# Patient Record
Sex: Female | Born: 1940
Health system: Southern US, Community
[De-identification: ages and names within clinical notes are randomized; demographics above are authoritative.]

## PROBLEM LIST (undated history)

## (undated) DIAGNOSIS — E785 Hyperlipidemia, unspecified: Secondary | ICD-10-CM

## (undated) DIAGNOSIS — R011 Cardiac murmur, unspecified: Secondary | ICD-10-CM

## (undated) DIAGNOSIS — K219 Gastro-esophageal reflux disease without esophagitis: Secondary | ICD-10-CM

## (undated) DIAGNOSIS — E559 Vitamin D deficiency, unspecified: Secondary | ICD-10-CM

## (undated) DIAGNOSIS — M810 Age-related osteoporosis without current pathological fracture: Secondary | ICD-10-CM

## (undated) HISTORY — DX: Gastro-esophageal reflux disease without esophagitis: K21.9

## (undated) HISTORY — PX: LEG SURGERY: SHX1003

## (undated) HISTORY — DX: Vitamin D deficiency, unspecified: E55.9

## (undated) HISTORY — PX: ABDOMINAL HYSTERECTOMY: SHX81

## (undated) HISTORY — DX: Cardiac murmur, unspecified: R01.1

## (undated) HISTORY — DX: Age-related osteoporosis without current pathological fracture: M81.0

## (undated) HISTORY — DX: Hyperlipidemia, unspecified: E78.5

---

## 1998-01-12 ENCOUNTER — Other Ambulatory Visit: Admission: RE | Admit: 1998-01-12 | Discharge: 1998-01-12 | Payer: Self-pay | Admitting: Obstetrics and Gynecology

## 1998-01-22 ENCOUNTER — Inpatient Hospital Stay (HOSPITAL_COMMUNITY): Admission: RE | Admit: 1998-01-22 | Discharge: 1998-01-24 | Payer: Self-pay | Admitting: Obstetrics and Gynecology

## 1999-04-16 ENCOUNTER — Other Ambulatory Visit: Admission: RE | Admit: 1999-04-16 | Discharge: 1999-04-16 | Payer: Self-pay | Admitting: Obstetrics and Gynecology

## 2000-07-07 ENCOUNTER — Other Ambulatory Visit: Admission: RE | Admit: 2000-07-07 | Discharge: 2000-07-07 | Payer: Self-pay | Admitting: Obstetrics and Gynecology

## 2006-10-25 ENCOUNTER — Ambulatory Visit: Admission: RE | Admit: 2006-10-25 | Discharge: 2006-10-25 | Payer: Self-pay | Admitting: Orthopedic Surgery

## 2006-10-25 ENCOUNTER — Ambulatory Visit: Payer: Self-pay | Admitting: Vascular Surgery

## 2006-10-27 ENCOUNTER — Inpatient Hospital Stay (HOSPITAL_COMMUNITY): Admission: RE | Admit: 2006-10-27 | Discharge: 2006-10-30 | Payer: Self-pay | Admitting: Orthopedic Surgery

## 2007-02-27 ENCOUNTER — Encounter: Payer: Self-pay | Admitting: Family Medicine

## 2007-03-02 ENCOUNTER — Encounter (INDEPENDENT_AMBULATORY_CARE_PROVIDER_SITE_OTHER): Payer: Self-pay | Admitting: Interventional Radiology

## 2007-03-02 ENCOUNTER — Ambulatory Visit (HOSPITAL_COMMUNITY): Admission: RE | Admit: 2007-03-02 | Discharge: 2007-03-02 | Payer: Self-pay | Admitting: Interventional Radiology

## 2007-03-14 ENCOUNTER — Encounter: Payer: Self-pay | Admitting: Interventional Radiology

## 2007-05-30 ENCOUNTER — Encounter: Admission: RE | Admit: 2007-05-30 | Discharge: 2007-05-30 | Payer: Self-pay | Admitting: Orthopedic Surgery

## 2008-02-07 ENCOUNTER — Inpatient Hospital Stay (HOSPITAL_COMMUNITY): Admission: RE | Admit: 2008-02-07 | Discharge: 2008-02-08 | Payer: Self-pay | Admitting: Urology

## 2008-11-27 IMAGING — CR DG CHEST 2V
2 series · 2 of 2 positions shown · non-contrast
Comparison: None.

CLINICAL DATA: Symptomatic distal femur hardware.  Preop respiratory exam.
 CHEST ? 2 VIEW:

[view not recorded (1 of 2)]
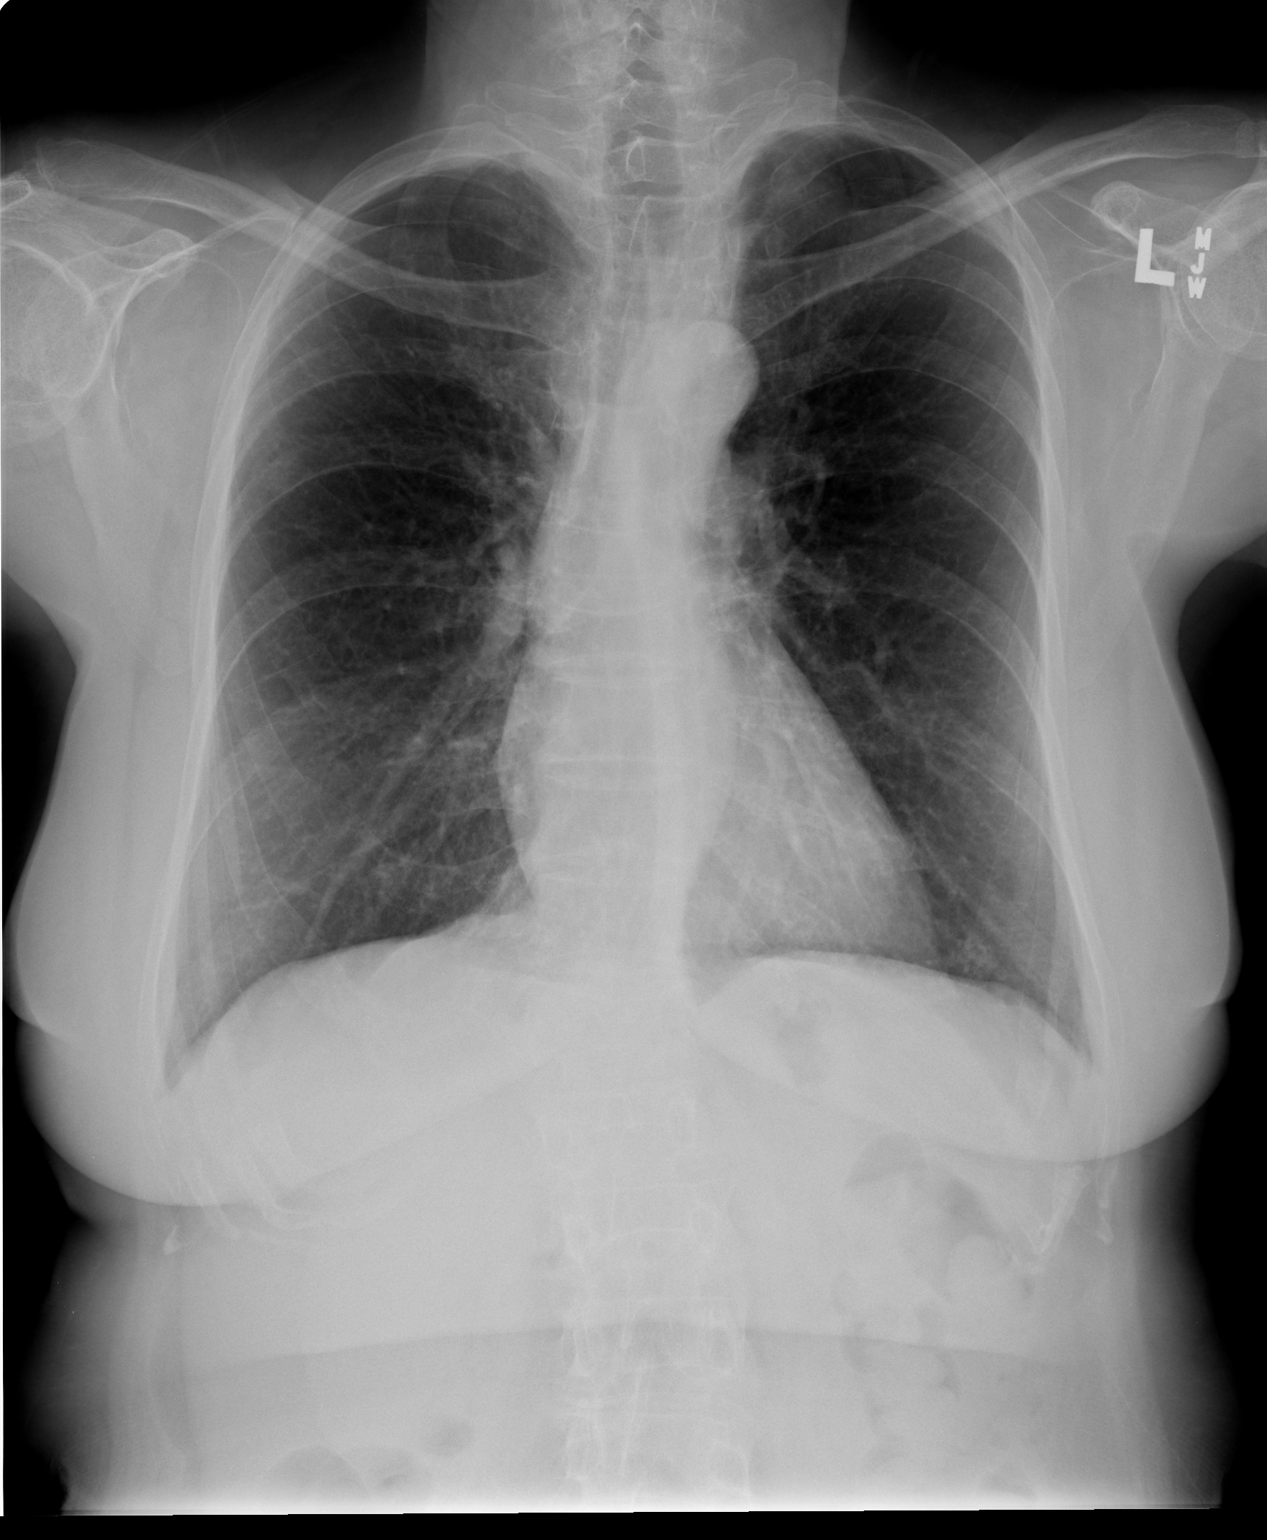

[view not recorded (2 of 2)]
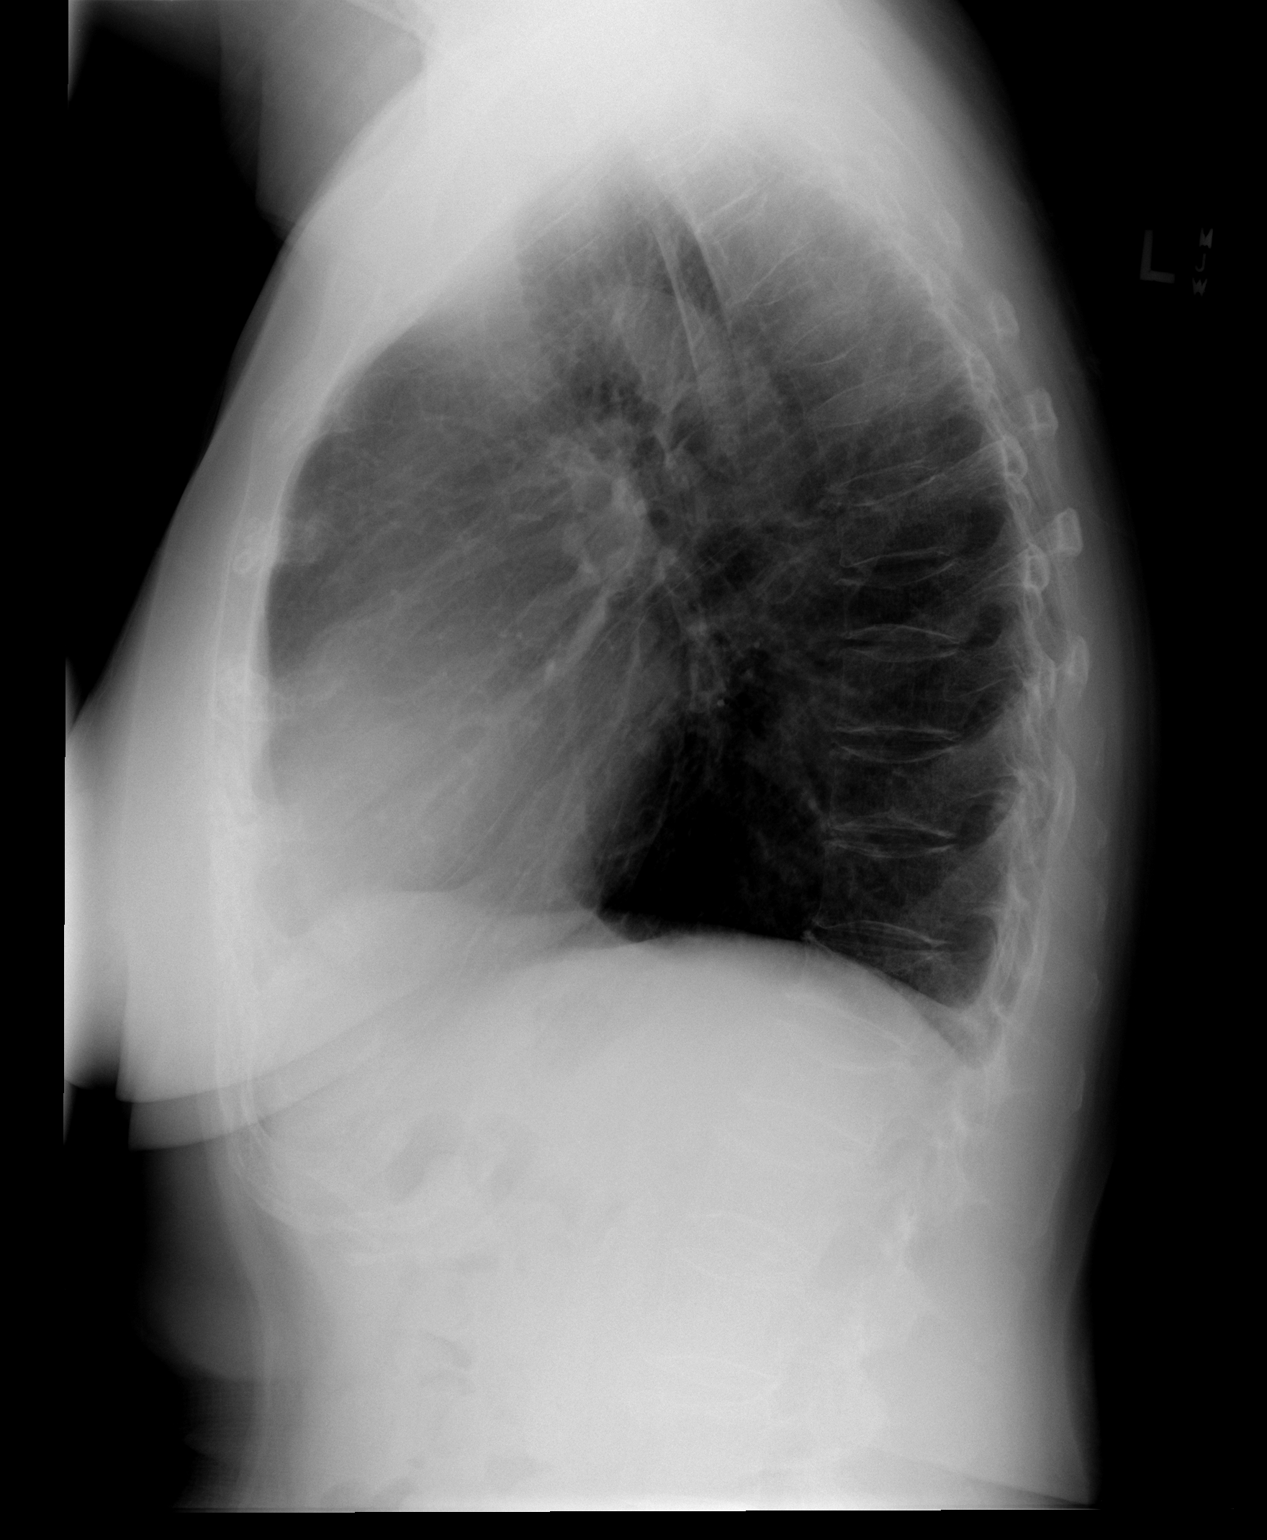

[2 of 2 positions shown; findings below may reference images not displayed]

FINDINGS: Heart size and mediastinal contours are normal.  Both lungs are clear.  Multiple thoracic vertebral body compression fracture deformities are noted.
IMPRESSION: 1. No active cardiopulmonary disease. 
 2. Multiple thoracic vertebral compression fractures, likely due to osteopenia.

## 2009-04-06 IMAGING — XA IR KYPHOPLASTY LUMBAR INIT
1 series · 12 of 24 positions shown · IV contrast (agent unspecified)
Comparison: none

CLINICAL DATA: Patient with severe intrathoracic pain secondary to compression fracture at T7.
 KYPHOPLASTY AT T7:
 Procedure:  Following the full explanation of the procedure, along with the potentially associated complications, an informed witnessed consent was obtained.
 The patient was laid prone on the fluoroscopic table.  The skin along the thoracic region was then prepped and draped in the usual sterile fashion.  The right pedicle at T7 was then infiltrated with 0.25% bupivacaine followed by the advancement of an 11-gauge Jamshidi needle through the right pedicle into the posterior one-third at T7.  This was then exchanged out for the Kyphon advanced osteo introducer system comprised of a working cannula and a KyphX osteo drill over a Kyphon osteo bone pin.  This combination was advanced until the tip of the KyphX osteo drill was in the posterior third at T7.  At this time, the bone pin was removed.  In the medial trajectory, the combination was advanced until the tip of the working cannula was in the posterior third at T7.  A core sample from this was sent for pathologic analysis.
 Through the working cannula, a Kyphon bone biopsy device was then advanced to within 5 mm of the anterior aspect of T7, and a core sample from this was also sent for pathologic analysis. 
 Through the working cannula, a Kyphon inflatable bone tamp 15 x 3 was advanced and positioned such that the distal marker was 5 mm from the anterior aspect of T7.  This balloon was then inflated with contrast using Kyphon inflation syringe device via microtubing.  The balloon was inflated until there was apposition with the superior endplate.  At this time, methylmethacrylate was reconstituted with tobramycin in the Kyphon bone mixing device system.  This was then loaded onto KyphX bone fillers. 
 The balloon was deflated and removed followed by the instillation of 1 bone filler equivalent of methylmethacrylate mixture at T7 with excellent filling in the AP and lateral projections.  No extravasation was noted in the disk space or posteriorly into the spinal canal or into the epidural or venous regions.  
 The working cannula and the bone fillers were then retrieved and removed.  Hemostasis was achieved at the skin entry site. 
 The patient tolerated the procedure well.  There were no acute complications.
 Medications utilized:  Versed 4 mg IV, fentanyl 100 mcg IV.

[Series 1: run · 12 of 76 slices shown]
[im 4/76]
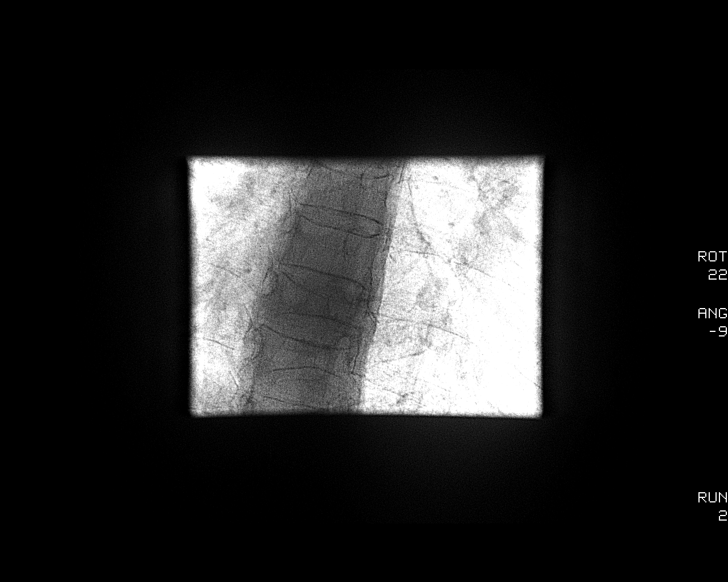
[im 10/76]
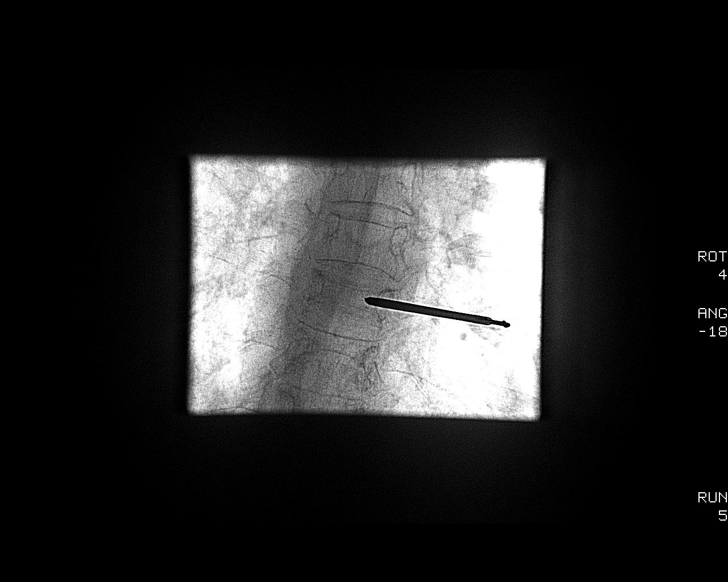
[im 17/76]
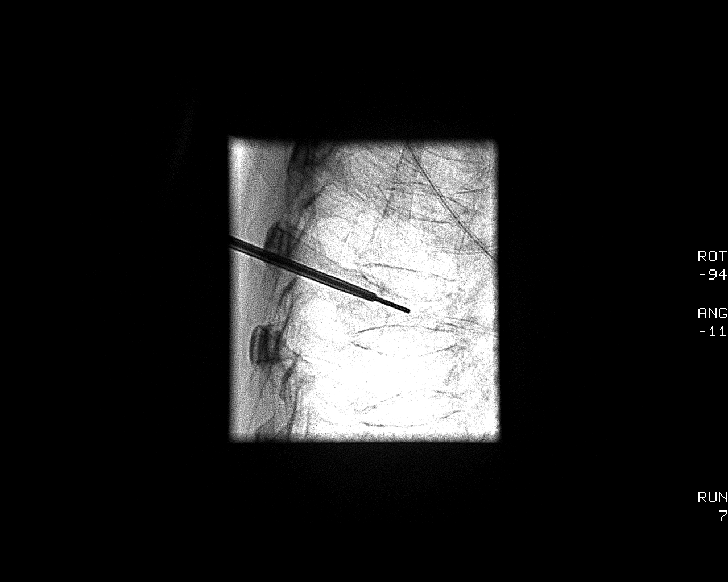
[im 23/76]
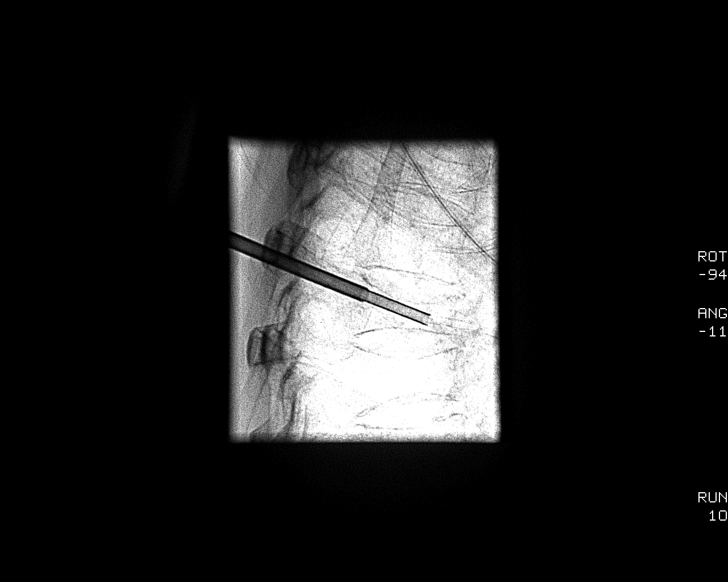
[im 30/76]
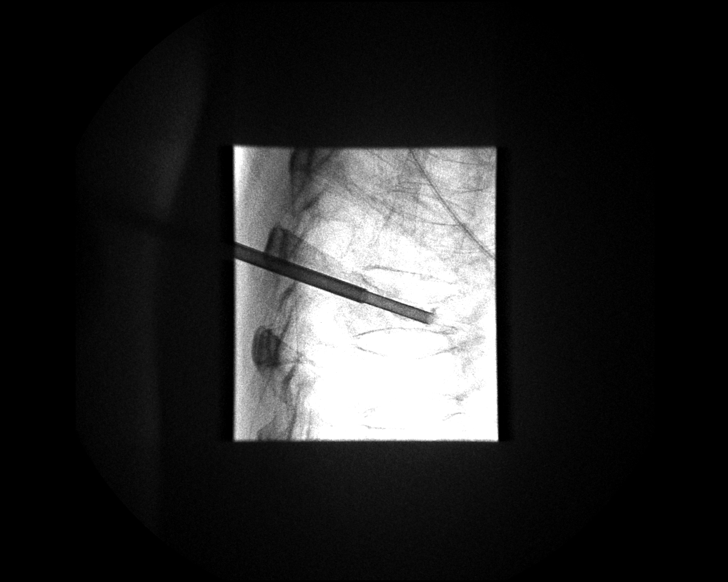
[im 36/76]
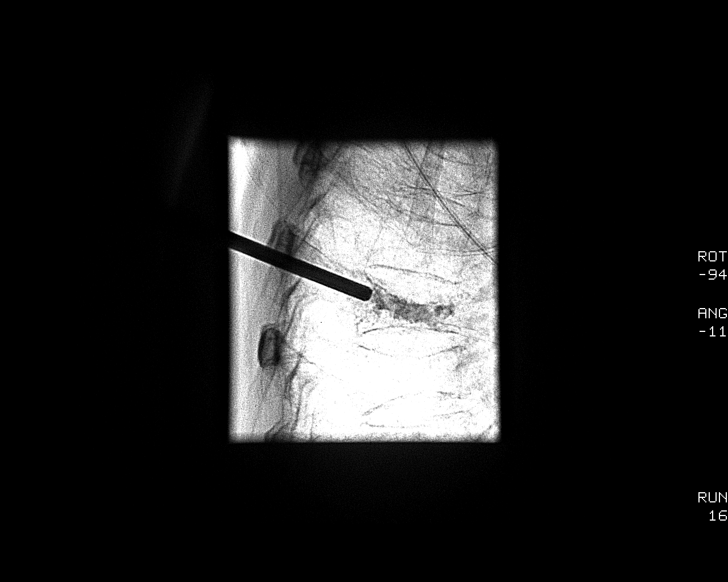
[im 43/76]
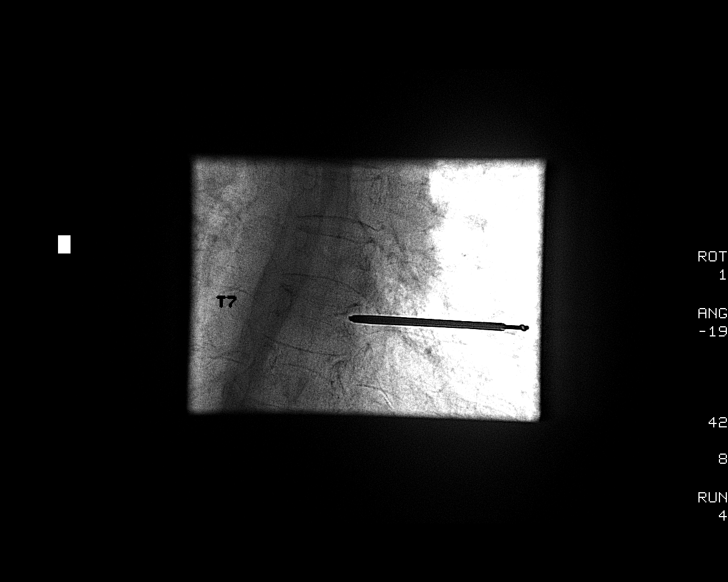
[im 49/76]
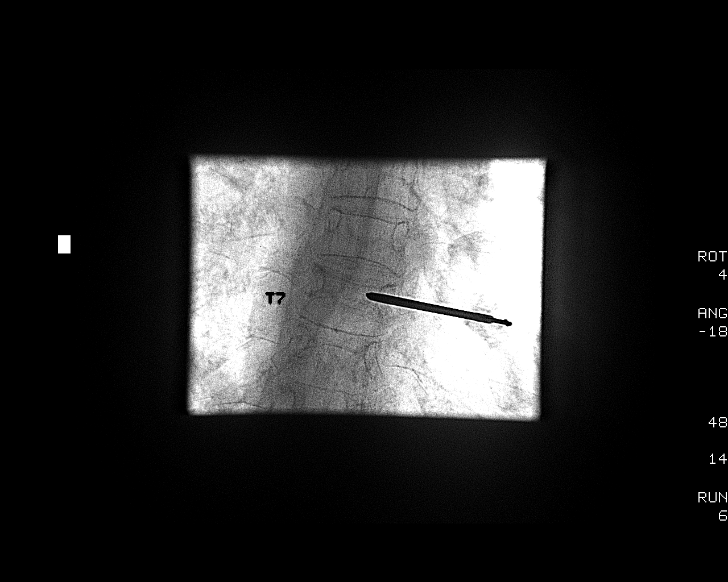
[im 56/76]
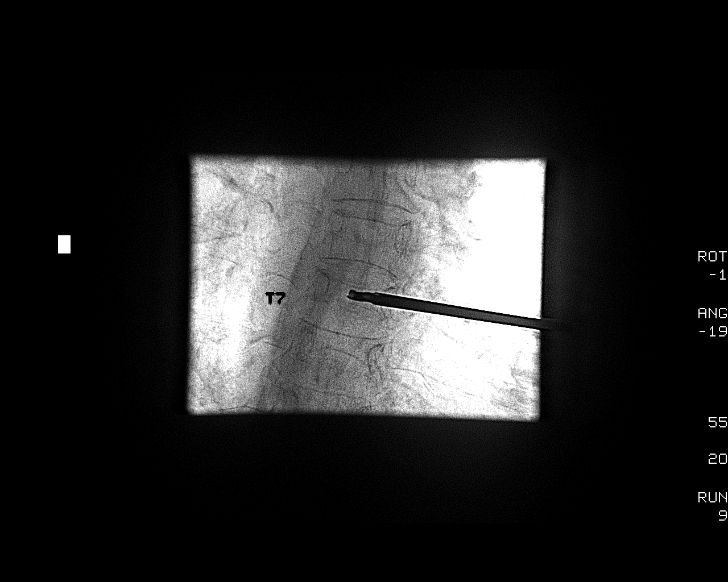
[im 62/76]
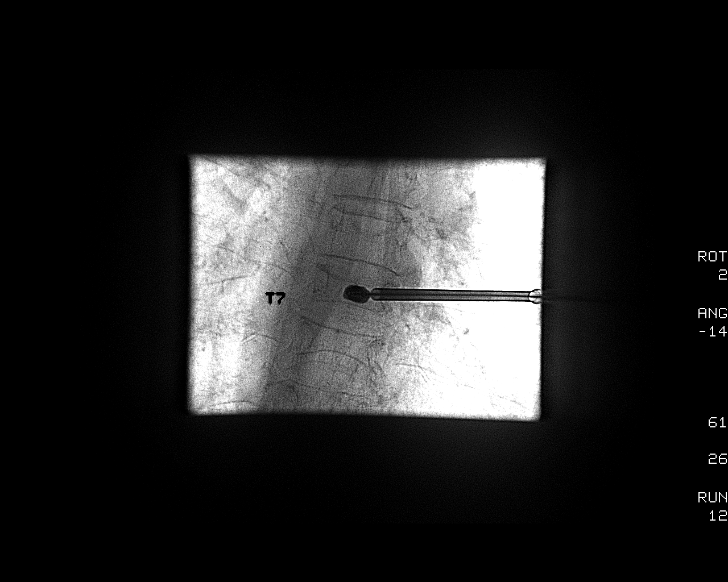
[im 69/76]
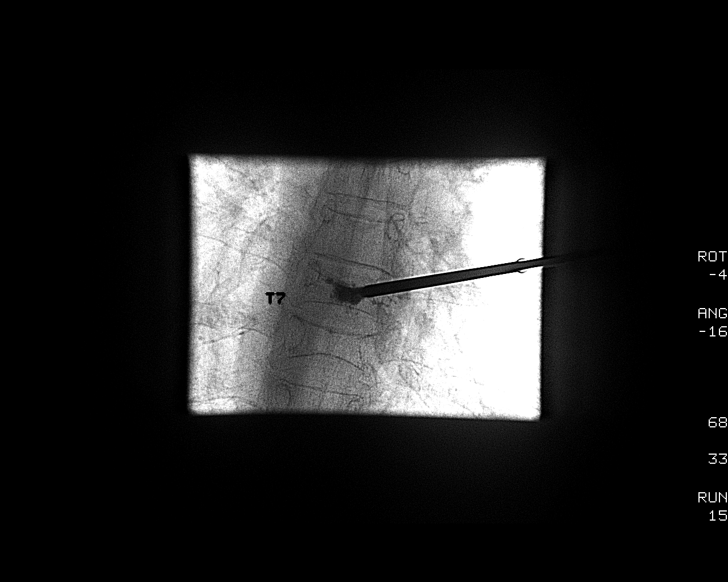
[im 76/76]
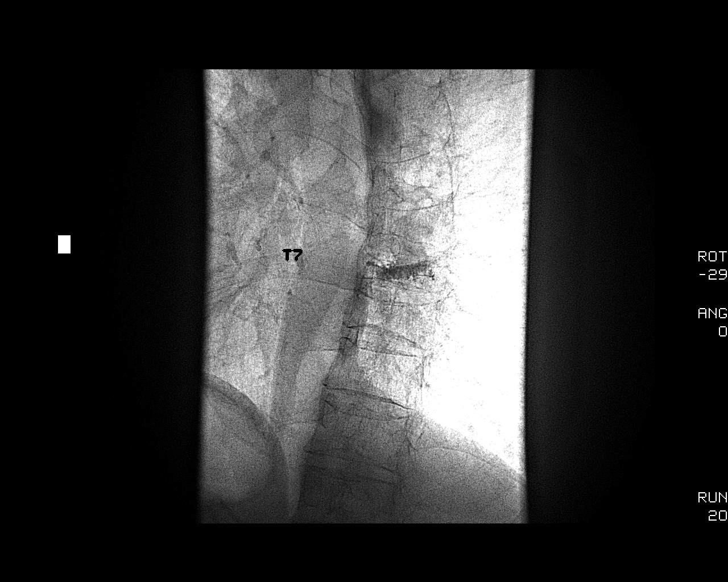

[12 of 24 positions shown; findings below may reference images not displayed]

IMPRESSION: 1.  Status post fluoroscopically guided needle placement for deep core bone biopsy at T7. 
 2.  Status post vertebral body augmentation for painful osteoporotic compression fracture at T7 using the balloon kyphoplasty technique as described above without event.

## 2010-02-14 ENCOUNTER — Encounter: Payer: Self-pay | Admitting: Orthopedic Surgery

## 2010-03-12 IMAGING — CR DG CHEST 2V
2 series · 2 of 2 positions shown · non-contrast
Comparison: 10/23/2006

CLINICAL DATA: Preop radiograph.  Vaginal vault prolapse.

CHEST - 2 VIEW

[w chest pa]
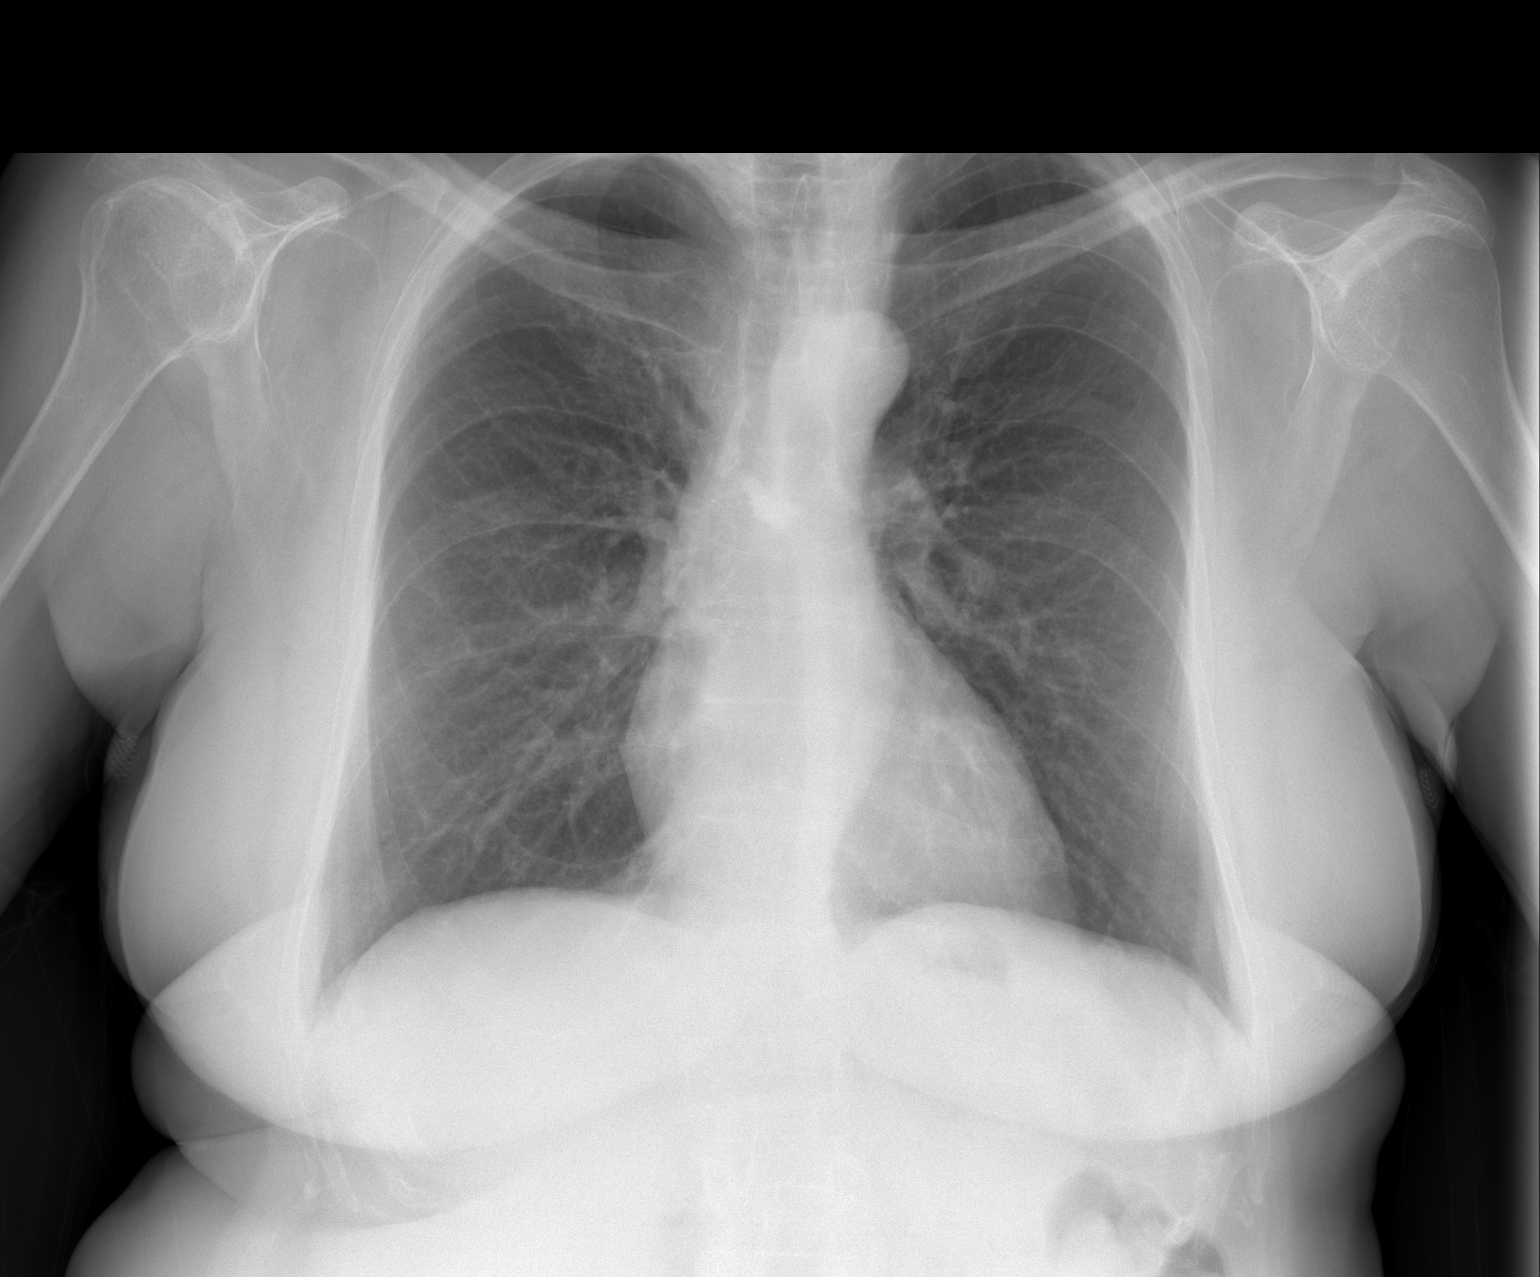

[w chest lat]
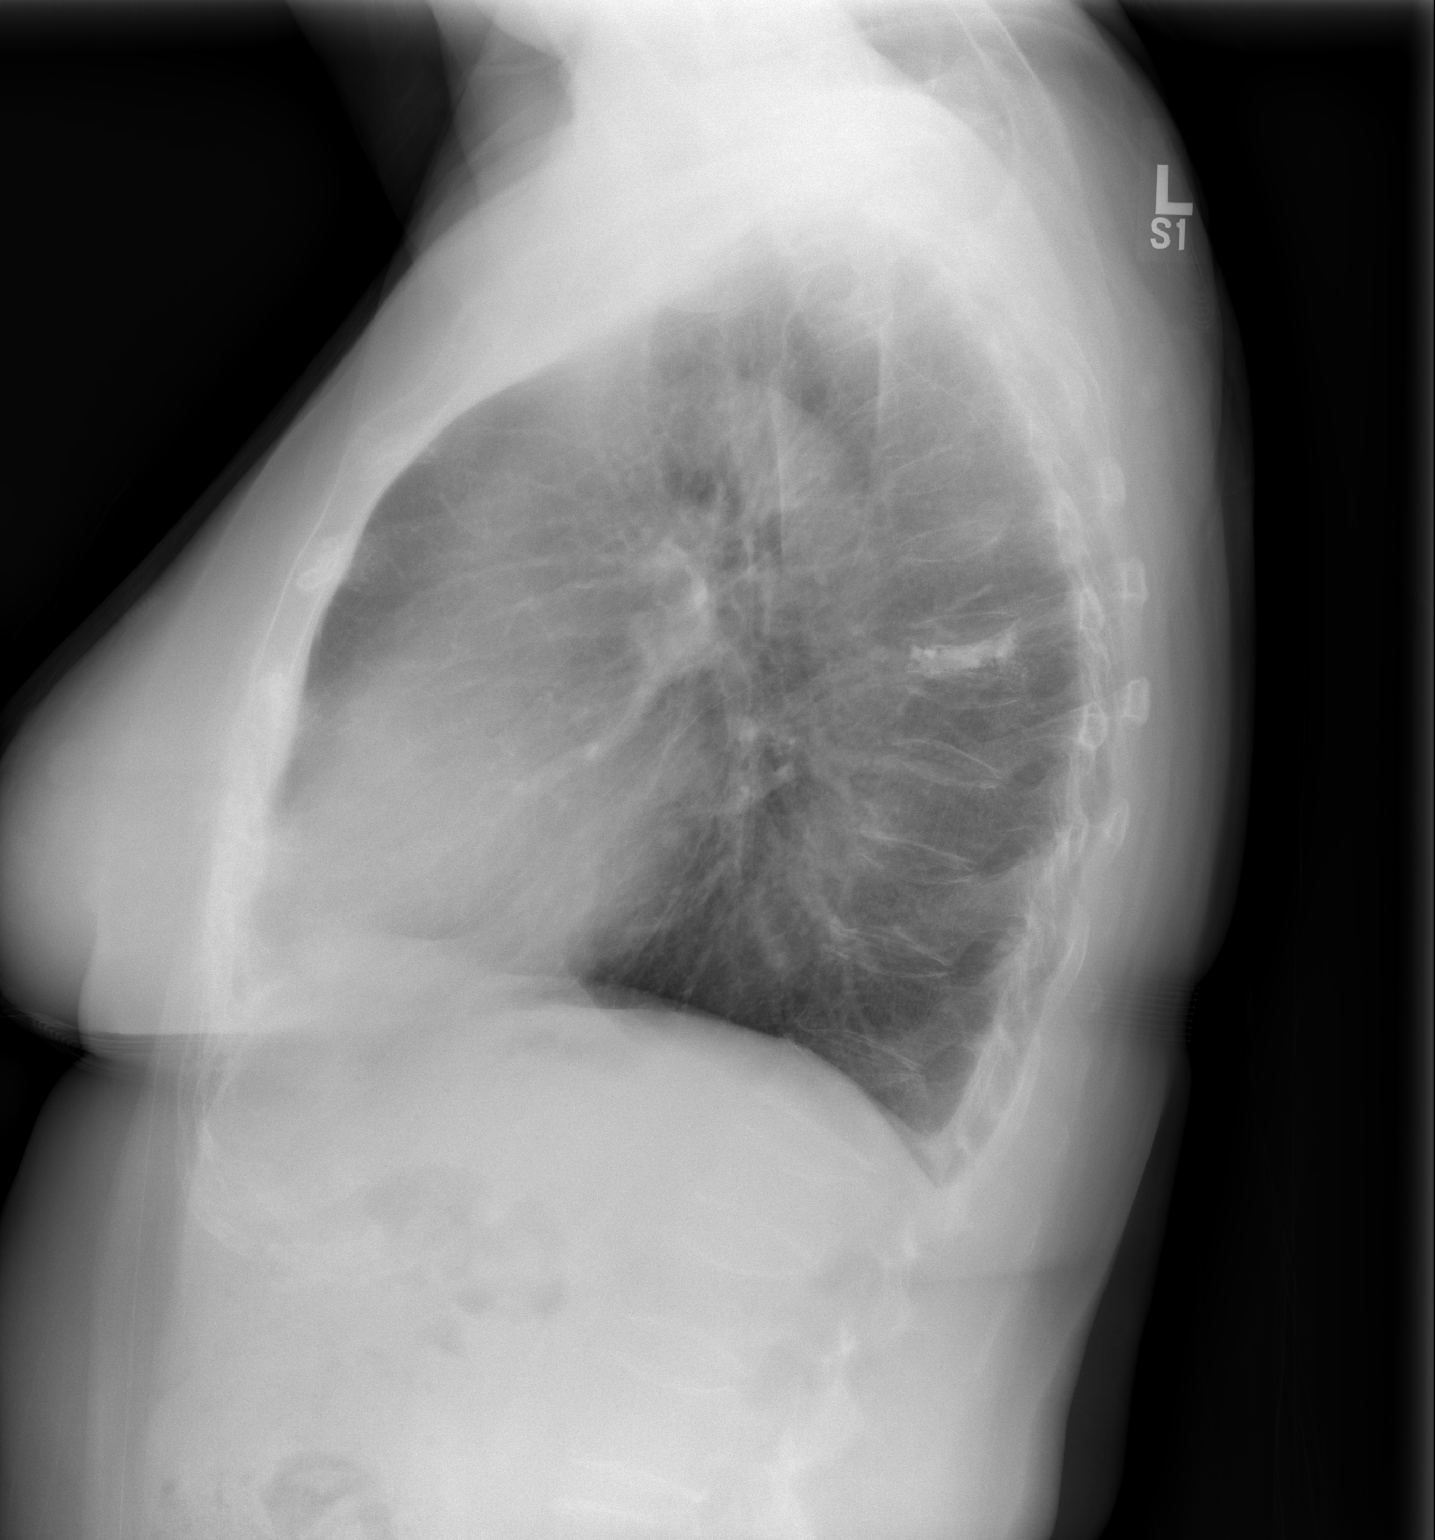

[2 of 2 positions shown; findings below may reference images not displayed]

FINDINGS: Heart size is normal.

There is no pleural effusion or pulmonary interstitial edema.

Multiple compression deformities are again noted within the
thoracic spine.

The patient is status post vertebroplasty at the T7 level.
IMPRESSION: 1.  No acute cardiopulmonary abnormalities.

## 2010-05-10 LAB — CBC
HCT: 43.5 % (ref 36.0–46.0)
Hemoglobin: 14.5 g/dL (ref 12.0–15.0)
MCHC: 33.3 g/dL (ref 30.0–36.0)
MCV: 90.6 fL (ref 78.0–100.0)
Platelets: 242 10*3/uL (ref 150–400)
RBC: 4.8 MIL/uL (ref 3.87–5.11)
RDW: 13.7 % (ref 11.5–15.5)
WBC: 5.9 10*3/uL (ref 4.0–10.5)

## 2010-05-10 LAB — TYPE AND SCREEN
ABO/RH(D): A POS
Antibody Screen: NEGATIVE

## 2010-05-10 LAB — BASIC METABOLIC PANEL
BUN: 10 mg/dL (ref 6–23)
CO2: 29 mEq/L (ref 19–32)
Calcium: 9.2 mg/dL (ref 8.4–10.5)
Chloride: 102 mEq/L (ref 96–112)
Creatinine, Ser: 0.61 mg/dL (ref 0.4–1.2)
GFR calc Af Amer: >60 mL/min (ref >60)
GFR calc non Af Amer: >60 mL/min (ref >60)
Glucose, Bld: 103 mg/dL — ABNORMAL HIGH (ref 70–99)
Potassium: 3.7 mEq/L (ref 3.5–5.1)
Sodium: 139 mEq/L (ref 135–145)

## 2010-05-10 LAB — HEMOGLOBIN AND HEMATOCRIT, BLOOD
HCT: 35.2 % — ABNORMAL LOW (ref 36.0–46.0)
HCT: 41.4 % (ref 36.0–46.0)
Hemoglobin: 11.7 g/dL — ABNORMAL LOW (ref 12.0–15.0)
Hemoglobin: 13.7 g/dL (ref 12.0–15.0)

## 2010-05-10 LAB — ABO/RH: ABO/RH(D): A POS

## 2010-06-08 NOTE — Op Note (Signed)
NAME:  Rhonda Rivera, Rhonda Rivera             ACCOUNT NO.:  [id number]   MEDICAL RECORD NO.:  [id number]          PATIENT TYPE:  INP   LOCATION:  NA                           FACILITY:  [facility]   PHYSICIAN:  Heloise Purpura, MD      DATE OF BIRTH:  1940-07-01   DATE OF PROCEDURE:  02/07/2008  DATE OF DISCHARGE:                               OPERATIVE REPORT   PREOPERATIVE DIAGNOSIS:  Vaginal wall prolapse.   POSTOPERATIVE DIAGNOSIS:  Vaginal wall prolapse.   PROCEDURE:  1. Robotic assisted laparoscopic sacrocolpopexy.  2. Cystoscopy.   SURGEON:  Heloise Purpura, MD   FIRST ASSISTANT:  Martina Sinner, MD   SECOND ASSISTANT:  Delia Chimes, NP   OTHER ASSISTANT:  Dr. Duane Boston.   ANESTHESIA:  General.   COMPLICATIONS:  None.   ESTIMATED BLOOD LOSS:  Less than 50 mL.   INDICATION:  [name] is a 70 year old female with post hysterectomy  vaginal vault prolapse.  She has undergone a thorough evaluation by Dr.  Alfredo Martinez and it was felt that she would best be served by an  abdominal sacrocolpopexy procedure.  She did wish to have this performed  in a minimally invasive fashion, therefore, was seen in consultation  specifically for a robotic assisted laparoscopic procedure.  The  potential risks, complications and alternative treatment options were  discussed in detail as well as the possibility that she may require a  rectocele repair by Dr. Sherron Monday at the time of this procedure.  The  potential risks, complications and alternative treatment options were  discussed in detail with the patient and informed consent was obtained.   DESCRIPTION OF PROCEDURE:  The patient was taken to the operating room  and a general anesthetic was administered.  She was given preoperative  antibiotics, placed in the dorsal lithotomy position and prepped and  draped in the usual sterile fashion.  Next a Foley catheter was placed.  A site was then selected just superior to the umbilicus in the  midline  for placement of the camera port.  This was placed using a standard open  Hasson technique which allowed entry into the peritoneal cavity under  direct vision and without difficulty.  There were no intra-abdominal  injuries or other abnormalities.  The remaining ports were then placed;  8 mm robotic ports were placed in the right lower quadrant, left lower  quadrant and far left lower quadrant.  A 12 mm port was placed in the  far right lateral abdominal wall.  All ports were placed under direct  vision without difficulty.  The surgical cart was then docked.  With a  combination of cautery and sharp dissection the apex of the vagina was  incised horizontally.  The EEA sizer was placed into the vagina to help  visualize the vaginal apex for this portion of the procedure.  The  peritoneal flaps on the anterior and posterior cuff of the vagina were  then developed in the avascular plane underneath the peritoneum.  Once  an adequate length of the vaginal cuff had been exposed on either side  attention turned  to the sacral promontory after the sigmoid colon had  been reflected laterally to the left.  The sacral promontory was easily  identified and was exposed.  The peritoneum over the sacral promontory  was identified and the periosteum was then exposed with care to avoid  any presacral vessels.  The Y-shaped IntePro mesh was then cut to the  appropriate length and brought into the operative field.  The posterior  and anterior portions of the Y-shaped graft were then sewn to the  respective sides of the vaginal cuff with four 2-0 Gore-Tex sutures on  each side of the vaginal cuff.  These were placed with figure-of-eight  deep sutures.  The vaginal apex was then supported with the EEA sizer to  determine the appropriate length of mesh needed.  The excess mesh was  then cut on the proximal side and three 2-0 Gore-Tex figure-of-eight  sutures were used to secure the mesh to the  periosteum of the sacral  promontory.  This resulted in excellent apical support of the vagina.  The peritoneum was then closed over the mesh with running 2-0 Vicryl  sutures.  Both the left lateral and right lateral cul-de-sac gutters  were then closed with 2-0 Vicryl sutures as well to prevent an  enterocele.  The surgical cart was then undocked.  Indigo carmine was  administered and cystoscopy was performed.  There was noted to be blue  reflux from either ureter indicating no evidence of ureteral injury.  The vaginal vault was then carefully examined by Dr. Sherron Monday.  He did  not feel that a rectocele repair was indicated.  Attention then turned  to closure.   Both 12 mm port sites were closed with zero Vicryl sutures that were  preplaced with the suture passer device.  All remaining ports were  removed under direct vision and the pneumoperitoneum was expelled.  All  port sites were injected with quarter percent Marcaine and  reapproximated at the skin level with staples.  Sterile dressings were  applied.  The patient appeared to tolerate the procedure well without  complications.  She was able to be extubated and transferred to the  recovery unit in satisfactory condition.      Heloise Purpura, MD  Electronically Signed     LB/MEDQ  D:  02/07/2008  T:  02/07/2008  Job:  [id number]
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
## 2010-06-08 NOTE — Op Note (Signed)
NAME:  Rhonda Rivera, Rhonda Rivera             ACCOUNT NO.:  [id number]   MEDICAL RECORD NO.:  [id number]          PATIENT TYPE:  INP   LOCATION:  5010                         FACILITY:  [facility]   PHYSICIAN:  Doralee Albino. Carola Frost, M.D. DATE OF BIRTH:  05/08/1940   DATE OF PROCEDURE:  10/27/2006  DATE OF DISCHARGE:                               OPERATIVE REPORT   PREOPERATIVE DIAGNOSES:  Left knee contracture. symptomatic hardware.   POSTOPERATIVE DIAGNOSES:  1. Left knee contracture. symptomatic hardware.  2. Severe post-traumatic arthrosis of the patella.   PROCEDURES:  1. Arthroscopic exploration, attempted lysis of adhesions.  2. Open lysis of adhesions.  3. Manipulation.  4. Removal of deep implant.   SURGEON:  Myrene Galas, M.D.   ASSISTANT:  None.   ANESTHESIA:  General.   FINDINGS:  Dense scar.   SPECIMENS:  None.   RANGE OF MOTION:  Premanipulation 25 degrees, post manipulation 120  degrees.   TOTAL TOURNIQUET TIME:  70 minutes.   COMPLICATIONS:  None.   ESTIMATED BLOOD LOSS:  70 mL.   DISPOSITION:  To PACU.   CONDITION:  Stable.   BRIEF SUMMARY AND INDICATIONS FOR PROCEDURE:  Rhonda Rivera is a 70-year-  old female who has been followed for a left distal femur fracture  treated by another Careers adviser in [place].  She had severe  difficulty despite every intervention that we could summon short of  surgery during her rehabilitation with regard to reestablishing range of  motion.  As a consequence, she had persistently restricted range of  motion, and we had been awaiting sufficient union of her fracture to  proceed with formal lysis and removal of adhesions and removal of the  implant which was also prominent medially and tender.  We discussed the  risks and benefits of surgery including the possibility of refracture,  infection, nerve injury, vessel injury, loss of the gained range of  motion, injury to the quadriceps, patella or tendon portion of the  extension mechanism, need for further surgery, failure to alleviate  symptoms, DVT, PE and others.  After full discussion, the patient wished  to proceed as did her husband.   BRIEF DESCRIPTION OF PROCEDURE:  Rhonda Rivera was administered  preoperative antibiotics, taken to the operating room where general  anesthesia was induced.  Her left lower extremity was prepped and draped  in usual sterile fashion.  We did place a tourniquet about the thigh.  We elevated the leg and exsanguinated with the Esmarch bandage.  We then  created standard inferomedial and inferolateral portals.  We were able  to get in the suprapatellar pouch with the scope but had considerable  difficulty doing so with the shaver secondary to the severe fibrosis.  We were able to begin with arthroscopic lysis.  However, the scar was  exceedingly thick, over a centimeter, and was throughout the knee.  It  became evident fairly quickly as we were unable to penetrate the bands  with the most aggressive shaver available.  At that time, we removed the  arthroscopic instruments from the knee and converted to open.   We  reopened the lateral incision which was over 15 cm.  There was a  dense fibrous scar.  We carried this down to the vastus and elevated the  distal quadriceps.  We removed extensive scar in the suprapatellar  region.  We could see where we had performed some release proximally  along the medial side.  The patella was completely entrenched within the  trochlear groove and could not be mobilized.  Consequently, we had to  extend the incision distally and begin working in very tedious fashion  to develop the plane deep to the patellar tendon and along the sulcus  until we could release enough fibrous tissue to actually tilt the  patella and then later to release the fibrous bands attaching from the  medial femoral condyle directly on the articular surface to the  articular surface of the patella.  As we released  this, we could see  that the patella had marked post-traumatic deformity and osteophytes  which protruded off of it directly into the joint surface.  These were  debrided with the rongeur, and eventually, we were able to obtain  sufficient mobility.  We then brought the knee up into flexion and both  palpably and audibly fibrous scar was released to the point that she was  able to obtain 120 degrees of motion.  The extensor mechanism remained  intact as visualized through the arthroscopic portal as well as  palpably.  The wounds were copiously irrigated and then closed in  standard layered fashion after the patellar plasty again to remove those  severe abnormalities.  We did release the tourniquet prior to closure  and obtain hemostasis with electrocautery.  Sterile gently compressive  dressing was applied with the knee in 90 degrees or more of flexion to  facilitate the use of the CPM in maximal flexion.  The deep drain was  placed prior to closure as well and sewed into place.   Rhonda Rivera is at high risk for recurrence of her contracture.  She will  have an epidural for the next 2-3 days and then will be discharged  immediately to physical therapy.  She will begin CPM motion and maximal  flexion and work back toward extension in hope to obtain and maintain as  much flexion as possible.  I have discussed all of these findings with  her husband who understands the risk.      Doralee Albino. Carola Frost, M.D.  Electronically Signed     MHH/MEDQ  D:  10/27/2006  T:  10/28/2006  Job:  [id number]

## 2010-06-11 NOTE — Discharge Summary (Signed)
NAME:  Rhonda Rivera, Rhonda Rivera             ACCOUNT NO.:  [id number]   MEDICAL RECORD NO.:  [id number]          PATIENT TYPE:  INP   LOCATION:  1434                         FACILITY:  [facility]   PHYSICIAN:  Heloise Purpura, MD      DATE OF BIRTH:  Dec 12, 1940   DATE OF ADMISSION:  02/07/2008  DATE OF DISCHARGE:  02/08/2008                               DISCHARGE SUMMARY   ADMISSION DIAGNOSIS:  Vaginal wall prolapse.   DISCHARGE DIAGNOSIS:  Vaginal wall prolapse.   PROCEDURE:  1. Robotic assisted laparoscopic sacral colpopexy.  2. Cystoscopy.   HISTORY AND PHYSICAL:  For full details, please see admission History  and Physical.   Briefly, [name] is a 70 year old female who was found to have  vaginal wall prolapse.   After careful consideration regarding management and options for  treatment, she elected to proceed with surgical therapy and a robotic  assisted laparoscopic sacral colpopexy.   HOSPITAL COURSE:  On February 07, 2008 she was taken to the operating  room and underwent a robotic assisted laparoscopic sacral colpopexy  which she tolerated well and without complications.  Postoperatively,  she was able to be transferred to a regular hospital room following  recovery from anesthesia. She was able to begin ambulation that evening.  She remained hemodynamically stable.  Her postoperative hematocrit was  41.4.  On the morning of postoperative day #1, her hematocrit was also  found to be stable at 35.2.  She maintained excellent urine output from  her Foley catheter  and no drainage from her vaginal packing, therefore,  her Foley catheter and vaginal packing were removed.  Her postvoid  residuals remained low.  She was re-evaluated on the afternoon of  postoperative day #1.  Her urine output, blood pressure and pulse  remained stable. She continued to have low postvoid residuals.  She was  also able to tolerate her diet without nausea or vomiting.  Therefore  she was felt to be  stable for discharge and had met all discharge  criteria.   DISPOSITION:  Home.   DISCHARGE MEDICATIONS:  She was provided a prescription for Vicodin to  take as needed for pain and told to use Colace as a stool softener.  She  was instructed to hold her aspirin, herbal supplements and multivitamins  for ten days.   DISCHARGE INSTRUCTIONS:  She was instructed to be ambulatory but  specifically told to refrain from any heavy lifting, strenuous activity  or driving. She was told to gradually advanced her diet as the course of  the next few days.   FOLLOWUP:  She will followup in one week for observation.      Delia Chimes, NP      Heloise Purpura, MD  Electronically Signed    MA/MEDQ  D:  02/12/2008  T:  02/12/2008  Job:  [id number]

## 2010-06-11 NOTE — Discharge Summary (Signed)
NAME:  JANUS, VLCEK NO.:  [id number]   MEDICAL RECORD NO.:  [id number]          PATIENT TYPE:  OUT   LOCATION:  DFTL                         FACILITY:  [facility]   PHYSICIAN:  Mearl Latin, PA       DATE OF BIRTH:  August 01, 1940   DATE OF ADMISSION:  10/25/2006  DATE OF DISCHARGE:  10/25/2006                               DISCHARGE SUMMARY   DISCHARGE DIAGNOSES:  1. Left knee contracture with symptomatic hardware.  2. Severe posttraumatic arthrosis of the patella.   PROCEDURES PERFORMED:  1. Arthroscopic exploration, attempted lysis of adhesion.  2. Open lysis of adhesions.  3. Manipulation.  4. Removal of deep implant of the left femur.   BRIEF SUMMARY AND HOSPITAL COURSE:  Ms. Manalang is a 70 year old  Caucasian female, who has a past history of a left distal femur  fracture.  This fracture was treated by another surgeon in [address].  After having an ORIF of the left distal femur, she was  subsequently followed by our practice for follow up care.  Over the  course of her rehabilitation from the initial surgery, Ms. Seier  continued to have difficulty with regards to establishing full range of  motion of upper left knee.  As a consequence, she had persistently  reached __________ good range of motion.  Surgical intervention had not  been pursued sooner due to the fact that we had been awaiting sufficient  union of her fracture, in order to proceed with a formal lysis and  removal of the adhesion and removal of the implant, which also caused  significant tenderness over the medial aspect, in addition to being  prominent over the medial aspect of the femur.  This surgical procedure  was performed on October 3rd, 2008, after which time Ms. Nokes was  able to be ranged 120 degrees of knee flexion.  Ms. Bark remained in  the hospital for 3 days, for continuous CPM use, in addition to pain  management.  It was thought that enabling her to remain  in the hospital  for a couple of days, she would be able to make more sufficient gain  with respect to her range of motion.  On postoperative day number 3, Ms.  Manolis was discharged from the hospital with instructions to resume  physical therapy the next day.  Ms. Slabaugh did not have any undue  complications during her hospital course.   PERTINENT LABORATORY FINDINGS:  Sodium of 137.  Potassium of 3.6.  Chloride of 107.  CO2 of 27.  Glucose 104.  BUN of 5.  Creatinine of  0.62.  GFR is greater than 60.  Calcium is 8.4.  CBC:  WBCs are 9.9.  Hemoglobin is 11.0.  Hematocrit is 32.8.  Platelets of 226.   OTHER SIGNIFICANT FINDINGS:  It is worth note that premanipulation, Ms.  Willette was able to range only 25 degrees of flexion of her left knee  and post manipulation, Ms. Willette could be ranged 120 degrees of  flexion in her left knee.  Despite the low hemoglobin and hematocrit,  Ms.  Willette remained asymptomatic upon discharge.  The patient did not  complain of any dizziness, lightheadedness, shortness of breath, chest  pain.   DISCHARGE MEDICATIONS:  1. Percocet 5/325.  2. Robaxin 500 mg.   Full medications:  1. Tramadol 50 mg taken as needed.  2. Sertraline 50 mg taken daily.  3. Xanax 0.5 mg as needed.   DISCHARGE INSTRUCTIONS:  Ms. Tobey continues to be a high risk for  reoccurrence of her contracture, given her past history.  Upon  discharge, she is instructed to report to Physical Therapy the next day.  She will also be given a CPM machine to facilitate range of motion of  her left knee.  She will be given the CPM motion and maximal flexion and  work back towards extension in hopes to obtain and maintain as much  flexion as possible.  Ms. Kuna will be weightbearing as tolerated on  the left lower extremity.  Due to the fact that the patient has  maintained an ambulatory status, no chemical DVT prophylaxis is  warranted at this time.  We plan on seeing Ms.  Willette in approximately  10-14 days for follow up at our office.      Mearl Latin, PA     KWP/MEDQ  D:  11/20/2006  T:  11/20/2006  Job:  [id number]

## 2010-10-15 LAB — BASIC METABOLIC PANEL
BUN: 6
CO2: 27
Calcium: 9
Chloride: 101
Creatinine, Ser: 0.58
GFR calc Af Amer: >60
GFR calc non Af Amer: >60
Glucose, Bld: 103 — ABNORMAL HIGH
Potassium: 3.6
Sodium: 137

## 2010-10-15 LAB — PROTIME-INR
INR: 0.9
Prothrombin Time: 12.7

## 2010-10-15 LAB — CBC
HCT: 44.6
Hemoglobin: 14.8
MCHC: 33.3
MCV: 84.6
Platelets: 349
RBC: 5.27 — ABNORMAL HIGH
RDW: 14.9
WBC: 7.7

## 2010-11-04 LAB — CBC
HCT: 32.8 — ABNORMAL LOW
HCT: 41.7
Hemoglobin: 11 — ABNORMAL LOW
Hemoglobin: 13.9
MCHC: 33.5
MCHC: 33.3
MCV: 84.8
MCV: 85.5
Platelets: 226
Platelets: 325
RBC: 3.86 — ABNORMAL LOW
RBC: 4.88
RDW: 15.3 — ABNORMAL HIGH
RDW: 15.6 — ABNORMAL HIGH
WBC: 9.9
WBC: 6.6

## 2010-11-04 LAB — BASIC METABOLIC PANEL
BUN: 5 — ABNORMAL LOW
BUN: 7
CO2: 27
CO2: 31
Calcium: 8.4
Calcium: 9.6
Chloride: 107
Chloride: 101
Creatinine, Ser: 0.62
Creatinine, Ser: 0.58
GFR calc Af Amer: >60
GFR calc Af Amer: >60
GFR calc non Af Amer: >60
GFR calc non Af Amer: >60
Glucose, Bld: 104 — ABNORMAL HIGH
Glucose, Bld: 102 — ABNORMAL HIGH
Potassium: 3.6
Potassium: 4.4
Sodium: 137
Sodium: 140

## 2010-11-04 LAB — PROTIME-INR
INR: 0.9
Prothrombin Time: 12.7

## 2010-11-04 LAB — URINALYSIS, ROUTINE W REFLEX MICROSCOPIC
Bilirubin Urine: NEGATIVE
Glucose, UA: NEGATIVE
Hgb urine dipstick: NEGATIVE
Ketones, ur: NEGATIVE
Nitrite: NEGATIVE
Protein, ur: NEGATIVE
Specific Gravity, Urine: 1.011
Urobilinogen, UA: 0.2
pH: 6.5

## 2010-11-04 LAB — APTT: aPTT: 27

## 2017-06-08 ENCOUNTER — Encounter: Payer: Self-pay | Admitting: *Deleted

## 2017-07-13 DIAGNOSIS — M810 Age-related osteoporosis without current pathological fracture: Secondary | ICD-10-CM

## 2017-07-13 DIAGNOSIS — K219 Gastro-esophageal reflux disease without esophagitis: Secondary | ICD-10-CM | POA: Insufficient documentation

## 2017-07-13 DIAGNOSIS — R011 Cardiac murmur, unspecified: Secondary | ICD-10-CM | POA: Insufficient documentation

## 2017-07-13 DIAGNOSIS — E559 Vitamin D deficiency, unspecified: Secondary | ICD-10-CM | POA: Insufficient documentation

## 2017-07-13 DIAGNOSIS — E785 Hyperlipidemia, unspecified: Secondary | ICD-10-CM

## 2017-07-13 HISTORY — DX: Gastro-esophageal reflux disease without esophagitis: K21.9

## 2017-07-13 HISTORY — DX: Hyperlipidemia, unspecified: E78.5

## 2017-07-13 HISTORY — DX: Age-related osteoporosis without current pathological fracture: M81.0

## 2017-07-13 HISTORY — DX: Cardiac murmur, unspecified: R01.1

## 2017-07-13 HISTORY — DX: Vitamin D deficiency, unspecified: E55.9

## 2017-07-31 NOTE — Progress Notes (Signed)
Cardiology Office Note:    Date:  08/01/2017   ID:  Rhonda Rivera, DOB 01/14/1941, MRN [id number]  PCP:  Blane Oharaox, Kirsten, MD  Cardiologist:  Norman HerrlichBrian Rocklyn Mayberry, MD   Referring MD: Marlyn CorporalYates, Kate H, PA  ASSESSMENT:    1. Cardiac murmur   2. Atrial premature beats   3. Nonrheumatic aortic valve stenosis   4. Hyperlipidemia, unspecified hyperlipidemia type    PLAN:    In order of problems listed above:  1. Clinically appears to have moderate aortic stenosis I asked her to go ahead and that echocardiogram for diagnosis and at this time would not advocate any cardiac interventions but indeed if it confirms aortic stenosis she need to start endocarditis prophylaxis and follow-up echocardiogram in 6 to 5864-month intervals. 2. Asymptomatic I would not give her any suppressant treatment 3. Moderate by physical exam echocardiogram to confirm and allow planning for follow-up and assessment of need for intervention. 4. Mild has not required lipid-lowering therapy stable  Next appointment 4 weeks   Medication Adjustments/Labs and Tests Ordered: Current medicines are reviewed at length with the patient today.  Concerns regarding medicines are outlined above.  Orders Placed This Encounter  Procedures  . EKG 12-Lead  . ECHOCARDIOGRAM COMPLETE   No orders of the defined types were placed in this encounter.    Chief Complaint  Patient presents with  . New Patient (Initial Visit)    History of Present Illness:    Rhonda Rivera is a 77 y.o. female who is being seen today for the evaluation of a heart murmur at the request of Marlyn CorporalYates, Kate H, [place].  He was described as grade 5-6 blowing murmur  She is a very vigorous active woman she has no history of previous heart murmur to her knowledge congenital rheumatic heart disease with the exception of hot humid weather where she fatigues a little bit easier she has had no exercise intolerance chest pain shortness of breath syncope palpitation TIA or  claudication.  She considers herself healthy she has mild hyperlipidemia has not required lipid-lowering therapy has no family history of valvular heart disease  She was noted to have a prominent heart murmur  Past Medical History:  Diagnosis Date  . Cardiac murmur 07/13/2017  . GERD (gastroesophageal reflux disease) 07/13/2017  . Hyperlipidemia 07/13/2017  . Osteoporosis 07/13/2017  . Vitamin D deficiency 07/13/2017    Past Surgical History:  Procedure Laterality Date  . ABDOMINAL HYSTERECTOMY    . LEG SURGERY      Current Medications: Current Meds  Medication Sig  . Ascorbic Acid (VITAMIN C) 1000 MG tablet Take 1,000 mg by mouth daily.  . Cholecalciferol (VITAMIN D) 2000 units CAPS Take 1 capsule by mouth daily.  . Coenzyme Q10 (CO Q 10) 100 MG CAPS Take 1 capsule by mouth daily.  Marland Kitchen. GRAPE SEED EXTRACT PO Take 1 tablet by mouth daily.     Allergies:   Acetaminophen; Celebrex [celecoxib]; and Penicillins   Social History   Socioeconomic History  . Marital status: Married    Spouse name: Not on file  . Number of children: Not on file  . Years of education: Not on file  . Highest education level: Not on file  Occupational History  . Not on file  Social Needs  . Financial resource strain: Not on file  . Food insecurity:    Worry: Not on file    Inability: Not on file  . Transportation needs:    Medical: Not on  file    Non-medical: Not on file  Tobacco Use  . Smoking status: Never Smoker  . Smokeless tobacco: Never Used  Substance and Sexual Activity  . Alcohol use: Never    Frequency: Never  . Drug use: Never  . Sexual activity: Not on file  Lifestyle  . Physical activity:    Days per week: Not on file    Minutes per session: Not on file  . Stress: Not on file  Relationships  . Social connections:    Talks on phone: Not on file    Gets together: Not on file    Attends religious service: Not on file    Active member of club or organization: Not on file     Attends meetings of clubs or organizations: Not on file    Relationship status: Not on file  Other Topics Concern  . Not on file  Social History Narrative  . Not on file     Family History: The patient's family history includes Heart attack in her father.  ROS:   ROS Please see the history of present illness.     All other systems reviewed and are negative.  EKGs/Labs/Other Studies Reviewed:    The following studies were reviewed today: PCP office note reviewed prior to visit  EKG:  EKG is  ordered today.  The ekg ordered today demonstrates sinus rhythm APCs and repetitive APCs otherwise normal  Recent Labs: Recent CMP normal cholesterol 228 HDL 80 LDL 138 No results found for requested labs within last 8760 hours.  Recent Lipid Panel No results found for: CHOL, TRIG, HDL, CHOLHDL, VLDL, LDLCALC, LDLDIRECT  Physical Exam:    VS:  BP 122/72 (BP Location: Left Arm, Patient Position: Sitting, Cuff Size: Normal)   Pulse 74   Ht 5\' 7"  (1.702 m)   Wt 122 lb 9.6 oz (55.6 kg)   SpO2 96%   BMI 19.20 kg/m     Wt Readings from Last 3 Encounters:  08/01/17 122 lb 9.6 oz (55.6 kg)     GEN: Grade 3/6 murmur aortic stenosis harsh late peaks radiates to the right clavicle and the right carotid artery well nourished, well developed in no acute distress HEENT: Normal NECK: No JVD; No carotid bruits LYMPHATICS: No lymphadenopathy CARDIAC: S2 appears split no AR RRR, no murmurs, rubs, gallops RESPIRATORY:  Clear to auscultation without rales, wheezing or rhonchi  ABDOMEN: Soft, non-tender, non-distended MUSCULOSKELETAL:  No edema; No deformity  SKIN: Warm and dry NEUROLOGIC:  Alert and oriented x 3 PSYCHIATRIC:  Normal affect     Signed, Norman Herrlich, MD  08/01/2017 11:48 AM    [facility]

## 2017-08-01 ENCOUNTER — Ambulatory Visit: Payer: Medicare Other | Admitting: Cardiology

## 2017-08-01 ENCOUNTER — Encounter: Payer: Self-pay | Admitting: Cardiology

## 2017-08-01 VITALS — BP 122/72 | HR 74 | Ht 67.0 in | Wt 122.6 lb

## 2017-08-01 DIAGNOSIS — I491 Atrial premature depolarization: Secondary | ICD-10-CM | POA: Insufficient documentation

## 2017-08-01 DIAGNOSIS — I350 Nonrheumatic aortic (valve) stenosis: Secondary | ICD-10-CM | POA: Diagnosis not present

## 2017-08-01 DIAGNOSIS — R011 Cardiac murmur, unspecified: Secondary | ICD-10-CM

## 2017-08-01 DIAGNOSIS — E785 Hyperlipidemia, unspecified: Secondary | ICD-10-CM

## 2017-08-01 NOTE — Patient Instructions (Signed)
Medication Instructions:  Your physician recommends that you continue on your current medications as directed. Please refer to the Current Medication list given to you today.   Labwork: NONE  Testing/Procedures: You had an EKG   Your physician has requested that you have an echocardiogram. Echocardiography is a painless test that uses sound waves to create images of your heart. It provides your doctor with information about the size and shape of your heart and how well your heart's chambers and valves are working. This procedure takes approximately one hour. There are no restrictions for this procedure.    Follow-Up: Your physician recommends that you schedule a follow-up appointment in: 4 weeks   Any Other Special Instructions Will Be Listed Below (If Applicable).     If you need a refill on your cardiac medications before your next appointment, please call your pharmacy.

## 2017-08-22 ENCOUNTER — Other Ambulatory Visit: Payer: Medicare Other

## 2017-08-29 ENCOUNTER — Other Ambulatory Visit: Payer: Medicare Other

## 2017-09-01 ENCOUNTER — Ambulatory Visit (INDEPENDENT_AMBULATORY_CARE_PROVIDER_SITE_OTHER): Payer: Medicare Other

## 2017-09-01 ENCOUNTER — Other Ambulatory Visit: Payer: Self-pay

## 2017-09-01 DIAGNOSIS — R011 Cardiac murmur, unspecified: Secondary | ICD-10-CM | POA: Diagnosis not present

## 2017-09-01 NOTE — Progress Notes (Signed)
Complete echocardiogram has been performed.  Jimmy Dimple Bastyr RDCS 

## 2017-10-03 NOTE — Progress Notes (Signed)
Cardiology Office Note:    Date:  10/04/2017   ID:  Rhonda Rivera, DOB [birth year], MRN [id number]  PCP:  Blane Ohara, MD  Cardiologist:  Norman Herrlich, MD    Referring MD: Blane Ohara, MD    ASSESSMENT:    1. Nonrheumatic aortic valve stenosis   2. Atrial premature beats    PLAN:    In order of problems listed above:  1. She remains asymptomatic moderate stenosis at risk for progression recheck echocardiogram follow-up in the office in 6 months 2. Stable asymptomatic   Next appointment: 6 months 1 week after echocardiogram   Medication Adjustments/Labs and Tests Ordered: Current medicines are reviewed at length with the patient today.  Concerns regarding medicines are outlined above.  No orders of the defined types were placed in this encounter.  No orders of the defined types were placed in this encounter.   No chief complaint on file.   History of Present Illness:    Rhonda Rivera is a 77 y.o. female with a hx of moderate aortic stenosis last seen 09/01/17.She remains asymptomatic, no chest pain, SOB, syncope or exercise intolerance. Compliance with diet, lifestyle and medications: yes  She is good healthcare literacy understands aortic stenosis and remains asymptomatic no palpitation chest pain shortness of breath or syncope.  I reviewed the degree of stenosis moderate asymptomatic status and told her she developed severe left ear with symptoms she would benefit from intervention preferably TAVR. Past Medical History:  Diagnosis Date  . Cardiac murmur 07/13/2017  . GERD (gastroesophageal reflux disease) 07/13/2017  . Hyperlipidemia 07/13/2017  . Osteoporosis 07/13/2017  . Vitamin D deficiency 07/13/2017    Past Surgical History:  Procedure Laterality Date  . ABDOMINAL HYSTERECTOMY    . LEG SURGERY      Current Medications: Current Meds  Medication Sig  . Ascorbic Acid (VITAMIN C) 1000 MG tablet Take 1,000 mg by mouth daily.  . Cholecalciferol  (VITAMIN D) 2000 units CAPS Take 1 capsule by mouth daily.  . Coenzyme Q10 (CO Q 10) 100 MG CAPS Take 1 capsule by mouth daily.  Marland Kitchen GRAPE SEED EXTRACT PO Take 1 tablet by mouth daily.     Allergies:   Acetaminophen; Celebrex [celecoxib]; and Penicillins   Social History   Socioeconomic History  . Marital status: Married    Spouse name: Not on file  . Number of children: Not on file  . Years of education: Not on file  . Highest education level: Not on file  Occupational History  . Not on file  Social Needs  . Financial resource strain: Not on file  . Food insecurity:    Worry: Not on file    Inability: Not on file  . Transportation needs:    Medical: Not on file    Non-medical: Not on file  Tobacco Use  . Smoking status: Never Smoker  . Smokeless tobacco: Never Used  Substance and Sexual Activity  . Alcohol use: Never    Frequency: Never  . Drug use: Never  . Sexual activity: Not on file  Lifestyle  . Physical activity:    Days per week: Not on file    Minutes per session: Not on file  . Stress: Not on file  Relationships  . Social connections:    Talks on phone: Not on file    Gets together: Not on file    Attends religious service: Not on file    Active member of club or organization: Not on  file    Attends meetings of clubs or organizations: Not on file    Relationship status: Not on file  Other Topics Concern  . Not on file  Social History Narrative  . Not on file     Family History: The patient's family history includes Heart attack in her father. ROS:   Please see the history of present illness.    All other systems reviewed and are negative.  EKGs/Labs/Other Studies Reviewed:    The following studies were reviewed today:    Echo: There was moderate to severe stenosis.  There was no regurgitation.    VTI ratio of LVOT to aortic valve: 0.23. Valve area (VTI): 0.64 cm^2 Mean gradient (S): 31 mm Hg. Peak gradient (S): 61 mm Hg.  Recent Labs: No  results found for requested labs within last 8760 hours.  Recent Lipid Panel No results found for: CHOL, TRIG, HDL, CHOLHDL, VLDL, LDLCALC, LDLDIRECT  Physical Exam:    VS:  BP (!) 144/64 (BP Location: Right Arm, Patient Position: Sitting, Cuff Size: Normal)   Pulse 67   Ht 5\' 7"  (1.702 m)   Wt 122 lb 3.2 oz (55.4 kg)   SpO2 98%   BMI 19.14 kg/m     Wt Readings from Last 3 Encounters:  10/04/17 122 lb 3.2 oz (55.4 kg)  08/01/17 122 lb 9.6 oz (55.6 kg)     GEN:  Well nourished, well developed in no acute distress HEENT: Normal NECK: No JVD; No carotid bruits LYMPHATICS: No lymphadenopathy CARDIAC: 3/6 harsh mid peak AS S2 splits radiates to carotidsRRR, RESPIRATORY:  Clear to auscultation without rales, wheezing or rhonchi  ABDOMEN: Soft, non-tender, non-distended MUSCULOSKELETAL:  No edema; No deformity  SKIN: Warm and dry NEUROLOGIC:  Alert and oriented x 3 PSYCHIATRIC:  Normal affect    Signed, Norman Herrlich, MD  10/04/2017 9:08 AM    [facility]

## 2017-10-04 ENCOUNTER — Ambulatory Visit (INDEPENDENT_AMBULATORY_CARE_PROVIDER_SITE_OTHER): Payer: Medicare Other | Admitting: Cardiology

## 2017-10-04 ENCOUNTER — Encounter: Payer: Self-pay | Admitting: Cardiology

## 2017-10-04 VITALS — BP 144/64 | HR 67 | Ht 67.0 in | Wt 122.2 lb

## 2017-10-04 DIAGNOSIS — I491 Atrial premature depolarization: Secondary | ICD-10-CM

## 2017-10-04 DIAGNOSIS — I350 Nonrheumatic aortic (valve) stenosis: Secondary | ICD-10-CM

## 2017-10-04 NOTE — Patient Instructions (Signed)
Medication Instructions:  Your physician recommends that you continue on your current medications as directed. Please refer to the Current Medication list given to you today.   Labwork: None  Testing/Procedures: Your physician has requested that you have an echocardiogram. Echocardiography is a painless test that uses sound waves to create images of your heart. It provides your doctor with information about the size and shape of your heart and how well your heart's chambers and valves are working. This procedure takes approximately one hour. There are no restrictions for this procedure.  Follow-Up: Your physician wants you to follow-up in: 6 months. You will receive a reminder letter in the mail two months in advance. If you don't receive a letter, please call our office to schedule the follow-up appointment.   If you need a refill on your cardiac medications before your next appointment, please call your pharmacy.   Thank you for choosing [facility]! Pavel Gadd, RN [phone]    Echocardiogram An echocardiogram, or echocardiography, uses sound waves (ultrasound) to produce an image of your heart. The echocardiogram is simple, painless, obtained within a short period of time, and offers valuable information to your health care provider. The images from an echocardiogram can provide information such as:  Evidence of coronary artery disease (CAD).  Heart size.  Heart muscle function.  Heart valve function.  Aneurysm detection.  Evidence of a past heart attack.  Fluid buildup around the heart.  Heart muscle thickening.  Assess heart valve function.  Tell a health care provider about:  Any allergies you have.  All medicines you are taking, including vitamins, herbs, eye drops, creams, and over-the-counter medicines.  Any problems you or family members have had with anesthetic medicines.  Any blood disorders you have.  Any surgeries you have had.  Any  medical conditions you have.  Whether you are pregnant or may be pregnant. What happens before the procedure? No special preparation is needed. Eat and drink normally. What happens during the procedure?  In order to produce an image of your heart, gel will be applied to your chest and a wand-like tool (transducer) will be moved over your chest. The gel will help transmit the sound waves from the transducer. The sound waves will harmlessly bounce off your heart to allow the heart images to be captured in real-time motion. These images will then be recorded.  You may need an IV to receive a medicine that improves the quality of the pictures. What happens after the procedure? You may return to your normal schedule including diet, activities, and medicines, unless your health care provider tells you otherwise. This information is not intended to replace advice given to you by your health care provider. Make sure you discuss any questions you have with your health care provider. Document Released: 01/08/2000 Document Revised: 08/29/2015 Document Reviewed: 09/17/2012 Elsevier Interactive Patient Education  2017 [employer]vier Inc[employer]  

## 2018-09-05 LAB — HM DEXA SCAN

## 2018-12-16 LAB — COLOGUARD: Cologuard: NEGATIVE

## 2019-04-12 ENCOUNTER — Encounter: Payer: Self-pay | Admitting: Family Medicine

## 2019-04-16 ENCOUNTER — Other Ambulatory Visit: Payer: Self-pay

## 2019-04-16 ENCOUNTER — Ambulatory Visit: Payer: Medicare PPO | Admitting: Legal Medicine

## 2019-04-16 ENCOUNTER — Encounter: Payer: Self-pay | Admitting: Legal Medicine

## 2019-04-16 ENCOUNTER — Ambulatory Visit: Payer: Medicare Other | Admitting: Family Medicine

## 2019-04-16 VITALS — BP 120/66 | HR 60 | Temp 97.7°F | Resp 17 | Ht 59.84 in | Wt 117.2 lb

## 2019-04-16 DIAGNOSIS — E782 Mixed hyperlipidemia: Secondary | ICD-10-CM | POA: Diagnosis not present

## 2019-04-16 DIAGNOSIS — M75101 Unspecified rotator cuff tear or rupture of right shoulder, not specified as traumatic: Secondary | ICD-10-CM | POA: Diagnosis not present

## 2019-04-16 DIAGNOSIS — M419 Scoliosis, unspecified: Secondary | ICD-10-CM | POA: Diagnosis not present

## 2019-04-16 DIAGNOSIS — R5383 Other fatigue: Secondary | ICD-10-CM | POA: Diagnosis not present

## 2019-04-16 DIAGNOSIS — R5382 Chronic fatigue, unspecified: Secondary | ICD-10-CM | POA: Diagnosis not present

## 2019-04-16 DIAGNOSIS — K2100 Gastro-esophageal reflux disease with esophagitis, without bleeding: Secondary | ICD-10-CM

## 2019-04-16 DIAGNOSIS — S32000A Wedge compression fracture of unspecified lumbar vertebra, initial encounter for closed fracture: Secondary | ICD-10-CM | POA: Insufficient documentation

## 2019-04-16 DIAGNOSIS — S32030A Wedge compression fracture of third lumbar vertebra, initial encounter for closed fracture: Secondary | ICD-10-CM | POA: Insufficient documentation

## 2019-04-16 HISTORY — DX: Wedge compression fracture of unspecified lumbar vertebra, initial encounter for closed fracture: S32.000A

## 2019-04-16 MED ORDER — TRAMADOL HCL 50 MG PO TABS: 50.0000 mg | ORAL_TABLET | Freq: Three times a day (TID) | ORAL | 2 refills | Status: DC | PRN

## 2019-04-16 NOTE — Assessment & Plan Note (Signed)
Patient has long term kyphoscoliosis from osteoporosis and compression fractures.  She wish to be able to stand straight.  I discussed her deformity.  She does not desire any osteoporosis medicines for back pain but want brace.  Braces in past did not fit.  I referred her to orthopedics to try to get fitted back brace.

## 2019-04-16 NOTE — Assessment & Plan Note (Signed)
Plan of care was formulated today.  She is doing well.  A plan of care was formulated using patient exam, tests and other sources to optimize care using evidence based information.  Recommend no smoking, no eating after supper, avoid fatty foods, elevate Head of bed, avoid tight fitting clothing.  Continue on grade seeds.

## 2019-04-16 NOTE — Assessment & Plan Note (Signed)

## 2019-04-16 NOTE — Progress Notes (Signed)
Established Patient Office Visit  Subjective:  Patient ID: Rhonda Rivera, female    DOB: 1940-03-04  Age: 79 y.o. MRN: [id number]  CC:  Chief Complaint  Patient presents with  . Shoulder Pain    Right shoulder pain with radiation to the hand    HPI Rhonda Rivera presents for chronic visit. She is an avid vitamin and naturopath.  She does not want to use prescription medicines. Her old records were reviewed.  She has compression fracture in past with pain. She saw surgeon in distant past. She has kyphoscoliosis and wants brace.  Braces in the past did not fit.  Refer to orthopedics to try to get better fit and appropriate brace.  She is resistant ot osteoporosis medicines.  She has left shoulder rotator cuff tear- chronic with crepitation. She has pain she saw the pain is 10/10.  She does not want surgery or to see orthopedics.  Past Medical History:  Diagnosis Date  . Cardiac murmur 07/13/2017  . GERD (gastroesophageal reflux disease) 07/13/2017  . GERD (gastroesophageal reflux disease) 07/13/2017  . Hyperlipidemia 07/13/2017  . Osteoporosis 07/13/2017  . Vitamin D deficiency 07/13/2017  . Wedge compression fracture of lumbar vertebra ([facility]) 04/16/2019    Past Surgical History:  Procedure Laterality Date  . ABDOMINAL HYSTERECTOMY    . LEG SURGERY      Family History  Problem Relation Age of Onset  . Heart attack Father     Social History   Socioeconomic History  . Marital status: Married    Spouse name: Not on file  . Number of children: 2  . Years of education: Not on file  . Highest education level: Not on file  Occupational History  . Occupation: Retired  Tobacco Use  . Smoking status: Never Smoker  . Smokeless tobacco: Never Used  Substance and Sexual Activity  . Alcohol use: Never  . Drug use: Never  . Sexual activity: Yes    Partners: Male  Other Topics Concern  . Not on file  Social History Narrative  . Not on file   Social Determinants of  Health   Financial Resource Strain:   . Difficulty of Paying Living Expenses:   Food Insecurity:   . Worried About Charity fundraiser in the Last Year:   . Arboriculturist in the Last Year:   Transportation Needs:   . Film/video editor (Medical):   Marland Kitchen Lack of Transportation (Non-Medical):   Physical Activity:   . Days of Exercise per Week:   . Minutes of Exercise per Session:   Stress:   . Feeling of Stress :   Social Connections:   . Frequency of Communication with Friends and Family:   . Frequency of Social Gatherings with Friends and Family:   . Attends Religious Services:   . Active Member of Clubs or Organizations:   . Attends Archivist Meetings:   Marland Kitchen Marital Status:   Intimate Partner Violence:   . Fear of Current or Ex-Partner:   . Emotionally Abused:   Marland Kitchen Physically Abused:   . Sexually Abused:     Outpatient Medications Prior to Visit  Medication Sig Dispense Refill  . Ascorbic Acid (VITAMIN C) 1000 MG tablet Take 1,000 mg by mouth daily.    . Cholecalciferol (VITAMIN D) 2000 units CAPS Take 1 capsule by mouth daily.    . Coenzyme Q10 (CO Q 10) 100 MG CAPS Take 1 capsule by mouth daily.    Marland Kitchen  GRAPE SEED EXTRACT PO Take 1 tablet by mouth daily.    . magnesium 30 MG tablet Take 30 mg by mouth daily.    . potassium chloride (MICRO-K) 10 MEQ CR capsule Take 10 mEq by mouth daily.    . Zinc 25 MG TABS Take 1 tablet by mouth.     No facility-administered medications prior to visit.    Allergies  Allergen Reactions  . Acetaminophen Other (See Comments)    "nervousness"  . Celebrex [Celecoxib] Other (See Comments)    "nervousness"  . Penicillins Swelling    Swelling and itching    ROS Review of Systems  Constitutional: Negative.   HENT: Negative.   Eyes: Negative.   Respiratory: Negative.   Cardiovascular: Negative.   Gastrointestinal: Negative.   Endocrine: Negative.   Genitourinary: Negative.   Musculoskeletal: Positive for back pain.   Skin: Negative.   Neurological: Negative.       Objective:    Physical Exam  Constitutional: She is oriented to person, place, and time. She appears well-developed and well-nourished.  HENT:  Head: Normocephalic and atraumatic.  Eyes: Pupils are equal, round, and reactive to light. Conjunctivae and EOM are normal.  Cardiovascular: Normal rate and regular rhythm.  Pulmonary/Chest: Effort normal and breath sounds normal.  Abdominal: Soft.  Musculoskeletal:     Right shoulder: Tenderness present. Decreased range of motion.     Cervical back: Normal range of motion and neck supple.     Lumbar back: Laceration, tenderness and bony tenderness present.     Comments: Severe kyphoscoliosis, right shoulder has crepitation, abduction 60 degrees, flexion 60 degrees, rotation < 90 degrees.  Neurological: She is alert and oriented to person, place, and time. She has normal reflexes.  Skin: Skin is warm.  Psychiatric: She has a normal mood and affect.  Vitals reviewed.   BP 120/66 (BP Location: Left Arm, Patient Position: Sitting)   Pulse 60   Temp 97.7 F (36.5 C) (Temporal)   Resp 17   Ht 4' 11.84" (1.52 m)   Wt 117 lb 3.2 oz (53.2 kg)   BMI 23.01 kg/m  Wt Readings from Last 3 Encounters:  04/16/19 117 lb 3.2 oz (53.2 kg)  10/04/17 122 lb 3.2 oz (55.4 kg)  08/01/17 122 lb 9.6 oz (55.6 kg)     Health Maintenance Due  Topic Date Due  . TETANUS/TDAP  Never done  . DEXA SCAN  Never done  . PNA vac Low Risk Adult (1 of 2 - PCV13) Never done  . INFLUENZA VACCINE  Never done    There are no preventive care reminders to display for this patient.  No results found for: TSH Lab Results  Component Value Date   WBC 5.9 02/05/2008   HGB 11.7 (L) 02/08/2008   HCT 35.2 (L) 02/08/2008   MCV 90.6 02/05/2008   PLT 242 02/05/2008   Lab Results  Component Value Date   NA 139 02/05/2008   K 3.7 02/05/2008   CO2 29 02/05/2008   GLUCOSE 103 (H) 02/05/2008   BUN 10 02/05/2008    CREATININE 0.61 02/05/2008   CALCIUM 9.2 02/05/2008   No results found for: CHOL No results found for: HDL No results found for: LDLCALC No results found for: TRIG No results found for: CHOLHDL No results found for: BJSE8B    Assessment & Plan:   Problem List Items Addressed This Visit      Digestive   GERD (gastroesophageal reflux disease)    Plan of  care was formulated today.  She is doing well.  A plan of care was formulated using patient exam, tests and other sources to optimize care using evidence based information.  Recommend no smoking, no eating after supper, avoid fatty foods, elevate Head of bed, avoid tight fitting clothing.  Continue on grade seeds.        Musculoskeletal and Integument   Rotator cuff tear, right    This is a chronic problem and she does not desire repair.  Try tramadol for pain, hydrocodone too strong.      Kyphoscoliosis - Primary    Patient has long term kyphoscoliosis from osteoporosis and compression fractures.  She wish to be able to stand straight.  I discussed her deformity.  She does not desire any osteoporosis medicines for back pain but want brace.  Braces in past did not fit.  I referred her to orthopedics to try to get fitted back brace.      Relevant Medications   traMADol (ULTRAM) 50 MG tablet   Other Relevant Orders   AMB referral to orthopedics     Other   Hyperlipidemia    AN INDIVIDUAL CARE PLAN was established and reinforced today.  The patient's status was assessed using clinical findings on exam, lab and other diagnostic tests. The patient's disease status was assessed based on evidence-based guidelines and found to be well controlled. MEDICATIONS were reviewed. SELF MANAGEMENT GOALS have been discussed and patient's success at attaining the goal of low cholesterol was assessed. RECOMMENDATION given include regular exercise 3 days a week and low cholesterol/low fat diet. CLINICAL SUMMARY including written plan to identify  barriers unique to the patient due to social or economic  reasons was discussed.      Relevant Orders   CBC with Differential   Comprehensive metabolic panel   Lipid Panel   Fatigue    Patient complains of chronic fatigue and anorexia.  We will start with TSH.  With her weight loss, she may need workup but at present she does not want to.  We discussed protein/caorie supplements with meals but she is not interested.  She was a "vitamin" workup.      Relevant Orders   TSH      Meds ordered this encounter  Medications  . traMADol (ULTRAM) 50 MG tablet    Sig: Take 1 tablet (50 mg total) by mouth every 8 (eight) hours as needed for up to 5 days.    Dispense:  30 tablet    Refill:  2    Follow-up: Return in about 3 months (around 07/17/2019) for fasting.    Brent Bulla, MD

## 2019-04-16 NOTE — Assessment & Plan Note (Signed)
Patient complains of chronic fatigue and anorexia.  We will start with TSH.  With her weight loss, she may need workup but at present she does not want to.  We discussed protein/caorie supplements with meals but she is not interested.  She was a "vitamin" workup.

## 2019-04-16 NOTE — Assessment & Plan Note (Signed)
This is a chronic problem and she does not desire repair.  Try tramadol for pain, hydrocodone too strong.

## 2019-04-17 LAB — CBC WITH DIFFERENTIAL/PLATELET
Basophils Absolute: 0 10*3/uL (ref 0.0–0.2)
Basos: 1 %
EOS (ABSOLUTE): 0.2 10*3/uL (ref 0.0–0.4)
Eos: 4 %
Hematocrit: 45.1 % (ref 34.0–46.6)
Hemoglobin: 14.4 g/dL (ref 11.1–15.9)
Immature Grans (Abs): 0 10*3/uL (ref 0.0–0.1)
Immature Granulocytes: 0 %
Lymphocytes Absolute: 1.1 10*3/uL (ref 0.7–3.1)
Lymphs: 25 %
MCH: 29 pg (ref 26.6–33.0)
MCHC: 31.9 g/dL (ref 31.5–35.7)
MCV: 91 fL (ref 79–97)
Monocytes Absolute: 0.3 10*3/uL (ref 0.1–0.9)
Monocytes: 8 %
Neutrophils Absolute: 2.7 10*3/uL (ref 1.4–7.0)
Neutrophils: 62 %
Platelets: 245 10*3/uL (ref 150–450)
RBC: 4.97 x10E6/uL (ref 3.77–5.28)
RDW: 13 % (ref 11.7–15.4)
WBC: 4.4 10*3/uL (ref 3.4–10.8)

## 2019-04-17 LAB — COMPREHENSIVE METABOLIC PANEL
ALT: 8 IU/L (ref 0–32)
AST: 14 IU/L (ref 0–40)
Albumin/Globulin Ratio: 2 (ref 1.2–2.2)
Albumin: 4.2 g/dL (ref 3.7–4.7)
Alkaline Phosphatase: 37 IU/L — ABNORMAL LOW (ref 39–117)
BUN/Creatinine Ratio: 13 (ref 12–28)
BUN: 9 mg/dL (ref 8–27)
Bilirubin Total: 0.3 mg/dL (ref 0.0–1.2)
CO2: 24 mmol/L (ref 20–29)
Calcium: 9.2 mg/dL (ref 8.7–10.3)
Chloride: 102 mmol/L (ref 96–106)
Creatinine, Ser: 0.67 mg/dL (ref 0.57–1.00)
GFR calc Af Amer: 97 mL/min/{1.73_m2} (ref >59)
GFR calc non Af Amer: 84 mL/min/{1.73_m2} (ref >59)
Globulin, Total: 2.1 g/dL (ref 1.5–4.5)
Glucose: 89 mg/dL (ref 65–99)
Potassium: 4.4 mmol/L (ref 3.5–5.2)
Sodium: 139 mmol/L (ref 134–144)
Total Protein: 6.3 g/dL (ref 6.0–8.5)

## 2019-04-17 LAB — CARDIOVASCULAR RISK ASSESSMENT

## 2019-04-17 LAB — LIPID PANEL
Chol/HDL Ratio: 2.6 ratio (ref 0.0–4.4)
Cholesterol, Total: 204 mg/dL — ABNORMAL HIGH (ref 100–199)
HDL: 78 mg/dL (ref >39)
LDL Chol Calc (NIH): 116 mg/dL — ABNORMAL HIGH (ref 0–99)
Triglycerides: 55 mg/dL (ref 0–149)
VLDL Cholesterol Cal: 10 mg/dL (ref 5–40)

## 2019-04-17 LAB — TSH: TSH: 2.64 u[IU]/mL (ref 0.450–4.500)

## 2019-04-17 NOTE — Progress Notes (Signed)
Chronic anemia, glucose 103, kidney tests normal, liver tests normal, LDL cholesterol high 116, TSH 2.64 normal lp

## 2019-05-16 DIAGNOSIS — M19011 Primary osteoarthritis, right shoulder: Secondary | ICD-10-CM | POA: Diagnosis not present

## 2019-05-16 DIAGNOSIS — M19019 Primary osteoarthritis, unspecified shoulder: Secondary | ICD-10-CM | POA: Diagnosis not present

## 2019-05-30 ENCOUNTER — Ambulatory Visit (INDEPENDENT_AMBULATORY_CARE_PROVIDER_SITE_OTHER): Payer: Medicare PPO | Admitting: Family Medicine

## 2019-05-30 ENCOUNTER — Other Ambulatory Visit: Payer: Self-pay

## 2019-05-30 ENCOUNTER — Encounter: Payer: Self-pay | Admitting: Family Medicine

## 2019-05-30 VITALS — BP 122/76 | HR 86 | Temp 97.1°F | Wt 116.0 lb

## 2019-05-30 DIAGNOSIS — F0390 Unspecified dementia without behavioral disturbance: Secondary | ICD-10-CM

## 2019-05-30 DIAGNOSIS — M19011 Primary osteoarthritis, right shoulder: Secondary | ICD-10-CM

## 2019-05-30 MED ORDER — DONEPEZIL HCL 5 MG PO TABS: 5.0000 mg | ORAL_TABLET | Freq: Every day | ORAL | 0 refills | Status: DC

## 2019-05-30 MED ORDER — TRIAMCINOLONE ACETONIDE 40 MG/ML IJ SUSP: 40.0000 mg | Freq: Once | INTRAMUSCULAR | Status: AC

## 2019-05-30 NOTE — Progress Notes (Signed)
Acute Office Visit  Subjective:    Patient ID: Rhonda Rivera, female    DOB: May 14, 1940, 79 y.o.   MRN: [id number]  Chief Complaint  Patient presents with  . Shoulder Pain     Rt shoulder pain has been present since March of 2020.    HPI Patient is in today for right shoulder pain which has been persistent since March 2020. She has issues raising her arms. She has tried aspirin 325 mg 2 at a time for pain, which helps but does not resolve her pain. She saw orthopedics, but did not like the doctor and so refused to take his recommendation. She is here for a steroid injection.  Patient has had progressively worsening memory. Her husband has told me separate that she is worsening. She admits to memory issues.   Past Medical History:  Diagnosis Date  . Cardiac murmur 07/13/2017  . GERD (gastroesophageal reflux disease) 07/13/2017  . GERD (gastroesophageal reflux disease) 07/13/2017  . Hyperlipidemia 07/13/2017  . Osteoporosis 07/13/2017  . Vitamin D deficiency 07/13/2017  . Wedge compression fracture of lumbar vertebra ([facility]) 04/16/2019    Past Surgical History:  Procedure Laterality Date  . ABDOMINAL HYSTERECTOMY    . LEG SURGERY      Family History  Problem Relation Age of Onset  . Heart attack Father     Social History   Socioeconomic History  . Marital status: Married    Spouse name: Not on file  . Number of children: 2  . Years of education: Not on file  . Highest education level: Not on file  Occupational History  . Occupation: Retired  Tobacco Use  . Smoking status: Never Smoker  . Smokeless tobacco: Never Used  Substance and Sexual Activity  . Alcohol use: Never  . Drug use: Never  . Sexual activity: Yes    Partners: Male  Other Topics Concern  . Not on file  Social History Narrative  . Not on file   Social Determinants of Health   Financial Resource Strain:   . Difficulty of Paying Living Expenses:   Food Insecurity:   . Worried About Patent examiner in the Last Year:   . Barista in the Last Year:   Transportation Needs:   . Freight forwarder (Medical):   Marland Kitchen Lack of Transportation (Non-Medical):   Physical Activity:   . Days of Exercise per Week:   . Minutes of Exercise per Session:   Stress:   . Feeling of Stress :   Social Connections:   . Frequency of Communication with Friends and Family:   . Frequency of Social Gatherings with Friends and Family:   . Attends Religious Services:   . Active Member of Clubs or Organizations:   . Attends Banker Meetings:   Marland Kitchen Marital Status:   Intimate Partner Violence:   . Fear of Current or Ex-Partner:   . Emotionally Abused:   Marland Kitchen Physically Abused:   . Sexually Abused:     Outpatient Medications Prior to Visit  Medication Sig Dispense Refill  . Ascorbic Acid (VITAMIN C) 1000 MG tablet Take 1,000 mg by mouth daily.    . Cholecalciferol (VITAMIN D) 2000 units CAPS Take 1 capsule by mouth daily.    . Coenzyme Q10 (CO Q 10) 100 MG CAPS Take 1 capsule by mouth daily.    Marland Kitchen GRAPE SEED EXTRACT PO Take 1 tablet by mouth daily.    . magnesium 30  MG tablet Take 30 mg by mouth daily.    . potassium chloride (MICRO-K) 10 MEQ CR capsule Take 10 mEq by mouth daily.    . Zinc 25 MG TABS Take 1 tablet by mouth.     No facility-administered medications prior to visit.    Allergies  Allergen Reactions  . Acetaminophen Other (See Comments)    "nervousness"  . Celebrex [Celecoxib] Other (See Comments)    "nervousness"  . Penicillins Swelling    Swelling and itching    Review of Systems  Constitutional: Negative for chills, fatigue and fever.  HENT: Positive for tinnitus. Negative for congestion, ear pain, rhinorrhea and sore throat.   Respiratory: Negative for cough and shortness of breath.   Cardiovascular: Negative for chest pain.  Genitourinary: Negative for dysuria and urgency.  Musculoskeletal: Positive for arthralgias (Rt shoulder. Sometimes her arm  tingles and hand is numb), back pain and myalgias.  Neurological: Negative for dizziness, weakness, light-headedness and headaches.  Psychiatric/Behavioral: Negative for dysphoric mood. The patient is not nervous/anxious.        Objective:    Physical Exam Vitals reviewed.  Constitutional:      Appearance: Normal appearance. She is normal weight.  Cardiovascular:     Rate and Rhythm: Normal rate and regular rhythm.     Heart sounds: Normal heart sounds.  Pulmonary:     Effort: Pulmonary effort is normal. No respiratory distress.     Breath sounds: Normal breath sounds.  Abdominal:     General: Abdomen is flat. Bowel sounds are normal.     Palpations: Abdomen is soft.     Tenderness: There is no abdominal tenderness.  Musculoskeletal:        General: Deformity (severe kyphosis. Unable to abduct right shoulder due to pain. Empty can sign.  Left shoulder is normal) present.  Neurological:     Mental Status: She is alert and oriented to person, place, and time.  Psychiatric:        Mood and Affect: Mood normal.        Behavior: Behavior normal.     BP 122/76   Pulse 86   Temp (!) 97.1 F (36.2 C)   Wt 116 lb (52.6 kg)   SpO2 96%   BMI 22.77 kg/m  Wt Readings from Last 3 Encounters:  05/30/19 116 lb (52.6 kg)  04/16/19 117 lb 3.2 oz (53.2 kg)  10/04/17 122 lb 3.2 oz (55.4 kg)    Health Maintenance Due  Topic Date Due  . COVID-19 Vaccine (1) Never done  . TETANUS/TDAP  Never done  . DEXA SCAN  Never done  . PNA vac Low Risk Adult (1 of 2 - PCV13) Never done    There are no preventive care reminders to display for this patient.   Lab Results  Component Value Date   TSH 2.640 04/16/2019   Lab Results  Component Value Date   WBC 4.4 04/16/2019   HGB 14.4 04/16/2019   HCT 45.1 04/16/2019   MCV 91 04/16/2019   PLT 245 04/16/2019   Lab Results  Component Value Date   NA 139 04/16/2019   K 4.4 04/16/2019   CO2 24 04/16/2019   GLUCOSE 89 04/16/2019   BUN 9  04/16/2019   CREATININE 0.67 04/16/2019   BILITOT 0.3 04/16/2019   ALKPHOS 37 (L) 04/16/2019   AST 14 04/16/2019   ALT 8 04/16/2019   PROT 6.3 04/16/2019   ALBUMIN 4.2 04/16/2019   CALCIUM 9.2 04/16/2019  Lab Results  Component Value Date   CHOL 204 (H) 04/16/2019   Lab Results  Component Value Date   HDL 78 04/16/2019   Lab Results  Component Value Date   LDLCALC 116 (H) 04/16/2019   Lab Results  Component Value Date   TRIG 55 04/16/2019   Lab Results  Component Value Date   CHOLHDL 2.6 04/16/2019   No results found for: HGBA1C     Assessment & Plan:  1. Osteoarthritis of right shoulder, unspecified osteoarthritis type Risks were discussed including bleeding, infection, increase in sugars if diabetic, atrophy at site of injection, and increased pain.  After consent was obtained, using sterile technique the posterior right shoulder was prepped with betadine and alcohol.  I was unable to enter the [facility] joint posteriorly (2 attempts.)  I successfully entered the [facility] joint anteriorly The joint was then injected with kenalog 40 mg and 5 ml plain Lidocaine was then injected and the needle withdrawn.  The procedure was well tolerated.   The patient is asked to continue to rest the joint for a few more days before resuming regular activities.  It may be more painful for the first 1-2 days.  Watch for fever, or increased swelling or persistent pain in the joint. Call or return to clinic prn if such symptoms occur or there is failure to improve as anticipated.  - triamcinolone acetonide (KENALOG-40) injection 40 mg  2. Dementia without behavioral disturbance, unspecified dementia type ([facility]CC) Start aricept 5 mg once daily at night.   Meds ordered this encounter  Medications  . triamcinolone acetonide (KENALOG-40) injection 40 mg  . donepezil (ARICEPT) 5 MG tablet    Sig: Take 1 tablet (5 mg total) by mouth at bedtime.    Dispense:  30 tablet    Refill:  0      Follow-up:  Return in about 4 weeks (around 06/27/2019).  An After Visit Summary was printed and given to the patient.  Blane Ohara [facility] [phone]

## 2019-05-31 DIAGNOSIS — F0390 Unspecified dementia without behavioral disturbance: Secondary | ICD-10-CM | POA: Insufficient documentation

## 2019-05-31 DIAGNOSIS — M19011 Primary osteoarthritis, right shoulder: Secondary | ICD-10-CM | POA: Insufficient documentation

## 2019-06-06 ENCOUNTER — Encounter: Payer: Self-pay | Admitting: Family Medicine

## 2019-06-17 DIAGNOSIS — G8929 Other chronic pain: Secondary | ICD-10-CM | POA: Insufficient documentation

## 2019-06-17 DIAGNOSIS — M25511 Pain in right shoulder: Secondary | ICD-10-CM | POA: Insufficient documentation

## 2019-06-17 DIAGNOSIS — M542 Cervicalgia: Secondary | ICD-10-CM | POA: Diagnosis not present

## 2019-06-17 DIAGNOSIS — M5030 Other cervical disc degeneration, unspecified cervical region: Secondary | ICD-10-CM | POA: Insufficient documentation

## 2019-06-28 ENCOUNTER — Ambulatory Visit: Payer: Medicare PPO | Admitting: Family Medicine

## 2019-07-03 DIAGNOSIS — M19011 Primary osteoarthritis, right shoulder: Secondary | ICD-10-CM | POA: Diagnosis not present

## 2019-07-09 ENCOUNTER — Other Ambulatory Visit: Payer: Self-pay | Admitting: Family Medicine

## 2019-07-09 MED ORDER — DONEPEZIL HCL 5 MG PO TABS: 5.0000 mg | ORAL_TABLET | Freq: Every day | ORAL | 2 refills | Status: DC

## 2019-07-18 ENCOUNTER — Ambulatory Visit: Payer: Medicare PPO | Admitting: Family Medicine

## 2019-08-26 ENCOUNTER — Other Ambulatory Visit: Payer: Self-pay

## 2019-08-26 ENCOUNTER — Encounter: Payer: Self-pay | Admitting: Family Medicine

## 2019-08-26 ENCOUNTER — Ambulatory Visit (INDEPENDENT_AMBULATORY_CARE_PROVIDER_SITE_OTHER): Payer: Medicare PPO | Admitting: Family Medicine

## 2019-08-26 VITALS — BP 120/64 | HR 68 | Temp 97.2°F | Resp 16 | Ht 61.0 in | Wt 112.6 lb

## 2019-08-26 DIAGNOSIS — R202 Paresthesia of skin: Secondary | ICD-10-CM | POA: Diagnosis not present

## 2019-08-26 DIAGNOSIS — M810 Age-related osteoporosis without current pathological fracture: Secondary | ICD-10-CM | POA: Diagnosis not present

## 2019-08-26 DIAGNOSIS — F0390 Unspecified dementia without behavioral disturbance: Secondary | ICD-10-CM

## 2019-08-26 DIAGNOSIS — G5601 Carpal tunnel syndrome, right upper limb: Secondary | ICD-10-CM | POA: Diagnosis not present

## 2019-08-26 DIAGNOSIS — G8929 Other chronic pain: Secondary | ICD-10-CM

## 2019-08-26 DIAGNOSIS — M25511 Pain in right shoulder: Secondary | ICD-10-CM | POA: Diagnosis not present

## 2019-08-26 MED ORDER — POTASSIUM CHLORIDE ER 10 MEQ PO CPCR: 10.0000 meq | ORAL_CAPSULE | Freq: Every day | ORAL | 0 refills | Status: DC

## 2019-08-26 NOTE — Patient Instructions (Addendum)
ORDERING BONE DENSITY TO RECHECK BONES ORDERING PROLIA FOR YOUR BONE STRENGTH. THIS IS AN INJECTION EVERY 6 MONTHS. CARPAL TUNNEL SYNDROME: USE CARPAL TUNNEL BRACES NIGHT AND DAY IF ABLE. NOT WHEN IN WATER.  Osteoporosis  Osteoporosis happens when your bones get thin and weak. This can cause your bones to break (fracture) more easily. You can do things at home to make your bones stronger. Follow these instructions at home:  Activity  Exercise as told by your doctor. Ask your doctor what activities are safe for you. You should do: ? Exercises that make your muscles work to hold your body weight up (weight-bearing exercises). These include tai chi, yoga, and walking. ? Exercises to make your muscles stronger. One example is lifting weights. Lifestyle  Limit alcohol intake to no more than 1 drink a day for nonpregnant women and 2 drinks a day for men. One drink equals 12 oz of beer, 5 oz of wine, or 1 oz of hard liquor.  Do not use any products that have nicotine or tobacco in them. These include cigarettes and e-cigarettes. If you need help quitting, ask your doctor. Preventing falls  Use tools to help you move around (mobility aids) as needed. These include canes, walkers, scooters, and crutches.  Keep rooms well-lit and free of clutter.  Put away things that could make you trip. These include cords and rugs.  Install safety rails on stairs. Install grab bars in bathrooms.  Use rubber mats in slippery areas, like bathrooms.  Wear shoes that: ? Fit you well. ? Support your feet. ? Have closed toes. ? Have rubber soles or low heels.  Tell your doctor about all of the medicines you are taking. Some medicines can make you more likely to fall. General instructions  Eat plenty of calcium and vitamin D. These nutrients are good for your bones. Good sources of calcium and vitamin D include: ? Some fatty fish, such as salmon and tuna. ? Foods that have calcium and vitamin D added to  them (fortified foods). For example, some breakfast cereals are fortified with calcium and vitamin D. ? Egg yolks. ? Cheese. ? Liver.  Take over-the-counter and prescription medicines only as told by your doctor.  Keep all follow-up visits as told by your doctor. This is important. Contact a doctor if:  You have not been tested (screened) for osteoporosis and you are: ? A woman who is age 48 or older. ? A man who is age 43 or older. Get help right away if:  You fall.  You get hurt. Summary  Osteoporosis happens when your bones get thin and weak.  Weak bones can break (fracture) more easily.  Eat plenty of calcium and vitamin D. These nutrients are good for your bones.  Tell your doctor about all of the medicines that you take. This information is not intended to replace advice given to you by your health care provider. Make sure you discuss any questions you have with your health care provider. Document Revised: 12/23/2016 Document Reviewed: 11/04/2016 Elsevier Patient Education  [place] Syndrome  Carpal tunnel syndrome is a condition that causes pain in your hand and arm. The carpal tunnel is a narrow area that is on the palm side of your wrist. Repeated wrist motion or certain diseases may cause swelling in the tunnel. This swelling can pinch the main nerve in the wrist (median nerve). What are the causes? This condition may be caused by:  Repeated wrist motions.  Wrist injuries.  Arthritis.  A sac of fluid (cyst) or abnormal growth (tumor) in the carpal tunnel.  Fluid buildup during pregnancy. Sometimes the cause is not known. What increases the risk? The following factors may make you more likely to develop this condition:  Having a job in which you move your wrist in the same way many times. This includes jobs like being a Midwife or a Conservation officer, nature.  Being a woman.  Having other health conditions, such as: ? Diabetes. ? Obesity. ? A  thyroid gland that is not active enough (hypothyroidism). ? Kidney failure. What are the signs or symptoms? Symptoms of this condition include:  A tingling feeling in your fingers.  Tingling or a loss of feeling (numbness) in your hand.  Pain in your entire arm. This pain may get worse when you bend your wrist and elbow for a long time.  Pain in your wrist that goes up your arm to your shoulder.  Pain that goes down into your palm or fingers.  A weak feeling in your hands. You may find it hard to grab and hold items. You may feel worse at night. How is this diagnosed? This condition is diagnosed with a medical history and physical exam. You may also have tests, such as:  Electromyogram (EMG). This test checks the signals that the nerves send to the muscles.  Nerve conduction study. This test checks how well signals pass through your nerves.  Imaging tests, such as X-rays, ultrasound, and MRI. These tests check for what might be the cause of your condition. How is this treated? This condition may be treated with:  Lifestyle changes. You will be asked to stop or change the activity that caused your problem.  Doing exercise and activities that make bones and muscles stronger (physical therapy).  Learning how to use your hand again (occupational therapy).  Medicines for pain and swelling (inflammation). You may have injections in your wrist.  A wrist splint.  Surgery. Follow these instructions at home: If you have a splint:  Wear the splint as told by your doctor. Remove it only as told by your doctor.  Loosen the splint if your fingers: ? Tingle. ? Lose feeling (become numb). ? Turn cold and blue.  Keep the splint clean.  If the splint is not waterproof: ? Do not let it get wet. ? Cover it with a watertight covering when you take a bath or a shower. Managing pain, stiffness, and swelling   If told, put ice on the painful area: ? If you have a removable splint,  remove it as told by your doctor. ? Put ice in a plastic bag. ? Place a towel between your skin and the bag. ? Leave the ice on for 20 minutes, 2-3 times per day. General instructions  Take over-the-counter and prescription medicines only as told by your doctor.  Rest your wrist from any activity that may cause pain. If needed, talk with your boss at work about changes that can help your wrist heal.  Do any exercises as told by your doctor, physical therapist, or occupational therapist.  Keep all follow-up visits as told by your doctor. This is important. Contact a doctor if:  You have new symptoms.  Medicine does not help your pain.  Your symptoms get worse. Get help right away if:  You have very bad numbness or tingling in your wrist or hand. Summary  Carpal tunnel syndrome is a condition that causes pain in your hand and  arm.  It is often caused by repeated wrist motions.  Lifestyle changes and medicines are used to treat this problem. Surgery may help in very bad cases.  Follow your doctor's instructions about wearing a splint, resting your wrist, keeping follow-up visits, and calling for help. This information is not intended to replace advice given to you by your health care provider. Make sure you discuss any questions you have with your health care provider. Document Revised: 05/19/2017 Document Reviewed: 05/19/2017 Elsevier Patient Education  [place].

## 2019-08-26 NOTE — Progress Notes (Signed)
Established Patient Office Visit  Subjective:  Patient ID: Rhonda Rivera, female    DOB: 1940/09/17  Age: 79 y.o. MRN: [id number]  CC:  Chief Complaint  Patient presents with  . Hyperlipidemia    HPI Rhonda Rivera presents for follow up of dementia.  Patient refuses to take any medications.  Per her husband she is getting worse.  She forgets everything that occurred 15 minutes prior.  She is still able to take care of her ADLs.  C/o right shoulder pain due to OA. Recommend surgery, but she refuses. She got an injection in her shoulder which helped.   Patient has osteoporosis with with known history of compression fractures.  Patient is taking vitamin D but not calcium.  She does drink lots of milk.  It has been 3 years since she has had a bone density test.  She was tried on alendronate and took 1 dose and discontinued it due to side effects.  Patient is complaining of numbness in burning in her hands and right upper arm.  She has tried ice and heat.  Patient does not like to take medication.   Past Medical History:  Diagnosis Date  . Cardiac murmur 07/13/2017  . GERD (gastroesophageal reflux disease) 07/13/2017  . GERD (gastroesophageal reflux disease) 07/13/2017  . Hyperlipidemia 07/13/2017  . Osteoporosis 07/13/2017  . Vitamin D deficiency 07/13/2017  . Wedge compression fracture of lumbar vertebra (HCC) 04/16/2019    Past Surgical History:  Procedure Laterality Date  . ABDOMINAL HYSTERECTOMY    . LEG SURGERY      Family History  Problem Relation Age of Onset  . Heart attack Father     Social History   Socioeconomic History  . Marital status: Married    Spouse name: Not on file  . Number of children: 2  . Years of education: Not on file  . Highest education level: Not on file  Occupational History  . Occupation: Retired  Tobacco Use  . Smoking status: Never Smoker  . Smokeless tobacco: Never Used  Vaping Use  . Vaping Use: Never used  Substance and  Sexual Activity  . Alcohol use: Never  . Drug use: Never  . Sexual activity: Yes    Partners: Male  Other Topics Concern  . Not on file  Social History Narrative  . Not on file   Social Determinants of Health   Financial Resource Strain:   . Difficulty of Paying Living Expenses:   Food Insecurity:   . Worried About Programme researcher, broadcasting/film/video in the Last Year:   . Barista in the Last Year:   Transportation Needs:   . Freight forwarder (Medical):   Marland Kitchen Lack of Transportation (Non-Medical):   Physical Activity:   . Days of Exercise per Week:   . Minutes of Exercise per Session:   Stress:   . Feeling of Stress :   Social Connections:   . Frequency of Communication with Friends and Family:   . Frequency of Social Gatherings with Friends and Family:   . Attends Religious Services:   . Active Member of Clubs or Organizations:   . Attends Banker Meetings:   Marland Kitchen Marital Status:   Intimate Partner Violence:   . Fear of Current or Ex-Partner:   . Emotionally Abused:   Marland Kitchen Physically Abused:   . Sexually Abused:     Outpatient Medications Prior to Visit  Medication Sig Dispense Refill  . Ascorbic Acid (VITAMIN  C) 1000 MG tablet Take 1,000 mg by mouth daily.    . Cholecalciferol (VITAMIN D) 2000 units CAPS Take 1 capsule by mouth daily.    . Coenzyme Q10 (CO Q 10) 100 MG CAPS Take 1 capsule by mouth daily.    Marland Kitchen GRAPE SEED EXTRACT PO Take 1 tablet by mouth daily.    . magnesium 30 MG tablet Take 30 mg by mouth daily.    . potassium chloride (MICRO-K) 10 MEQ CR capsule Take 10 mEq by mouth daily.    Marland Kitchen donepezil (ARICEPT) 5 MG tablet Take 1 tablet (5 mg total) by mouth at bedtime. 30 tablet 2  . Zinc 25 MG TABS Take 1 tablet by mouth.     Facility-Administered Medications Prior to Visit  Medication Dose Route Frequency Provider Last Rate Last Admin  . triamcinolone acetonide (KENALOG-40) injection 40 mg  40 mg Other Once Blane Ohara, MD        Allergies   Allergen Reactions  . Acetaminophen Other (See Comments)    "nervousness"  . Celebrex [Celecoxib] Other (See Comments)    "nervousness"  . Penicillins Swelling    Swelling and itching    ROS Review of Systems  Constitutional: Negative for chills, fatigue and fever.  HENT: Negative for congestion, ear pain and sore throat.   Respiratory: Negative for cough and shortness of breath.   Cardiovascular: Negative for chest pain and palpitations.  Gastrointestinal: Negative for abdominal pain, constipation, diarrhea, nausea and vomiting.  Genitourinary: Negative for dysuria and frequency.  Musculoskeletal: Positive for arthralgias (right shoulder pain).  Skin: Negative for rash.  Neurological: Negative for dizziness and syncope.  Psychiatric/Behavioral: Negative for dysphoric mood. The patient is not nervous/anxious.       Objective:    Physical Exam Constitutional:      Appearance: She is well-developed.  Cardiovascular:     Rate and Rhythm: Normal rate and regular rhythm.     Heart sounds: Normal heart sounds.  Pulmonary:     Effort: Pulmonary effort is normal.     Breath sounds: Normal breath sounds.  Abdominal:     General: Bowel sounds are normal.     Palpations: Abdomen is soft.     Tenderness: There is no abdominal tenderness.  Musculoskeletal:        General: Deformity (KYPHOSIS.  atrophy of right hand. paresthesias elicited in right hand and arm with tinel's and phalens tests.) present.  Psychiatric:        Mood and Affect: Mood normal.        Behavior: Behavior normal.     BP (!) 120/64   Pulse 68   Temp (!) 97.2 F (36.2 C)   Resp 16   Ht 5\' 1"  (1.549 m)   Wt 112 lb 9.6 oz (51.1 kg)   BMI 21.28 kg/m  Wt Readings from Last 3 Encounters:  08/26/19 112 lb 9.6 oz (51.1 kg)  05/30/19 116 lb (52.6 kg)  04/16/19 117 lb 3.2 oz (53.2 kg)     Health Maintenance Due  Topic Date Due  . Hepatitis C Screening  Never done  . COVID-19 Vaccine (1) Never done  .  TETANUS/TDAP  Never done  . DEXA SCAN  Never done  . PNA vac Low Risk Adult (1 of 2 - PCV13) Never done  . INFLUENZA VACCINE  08/25/2019    There are no preventive care reminders to display for this patient.  Lab Results  Component Value Date   TSH 2.640 04/16/2019  Lab Results  Component Value Date   WBC 4.4 04/16/2019   HGB 14.4 04/16/2019   HCT 45.1 04/16/2019   MCV 91 04/16/2019   PLT 245 04/16/2019   Lab Results  Component Value Date   NA 139 04/16/2019   K 4.4 04/16/2019   CO2 24 04/16/2019   GLUCOSE 89 04/16/2019   BUN 9 04/16/2019   CREATININE 0.67 04/16/2019   BILITOT 0.3 04/16/2019   ALKPHOS 37 (L) 04/16/2019   AST 14 04/16/2019   ALT 8 04/16/2019   PROT 6.3 04/16/2019   ALBUMIN 4.2 04/16/2019   CALCIUM 9.2 04/16/2019   Lab Results  Component Value Date   CHOL 204 (H) 04/16/2019   Lab Results  Component Value Date   HDL 78 04/16/2019   Lab Results  Component Value Date   LDLCALC 116 (H) 04/16/2019   Lab Results  Component Value Date   TRIG 55 04/16/2019   Lab Results  Component Value Date   CHOLHDL 2.6 04/16/2019   No results found for: HGBA1C    Assessment & Plan:   1. Age-related osteoporosis without current pathological fracture Recommend increase calcium in diet or with supplementation (1200-1500 mg daily) - VITAMIN D 25 Hydroxy (Vit-D Deficiency, Fractures); Future  2. Paresthesia Likely due to cts.  - CBC with Differential/Platelet - Comprehensive metabolic panel - TSH - B12 and Folate Panel  3. Carpal tunnel syndrome of right wrist Recommend cts brace.  4. Chronic right shoulder pain Follow up with orthopedics  5. Dementia without behavioral disturbance, unspecified dementia type (HCC) Difficult to assess. Recommended medicines, but pt refuses. Husband say she cannot remember anything we discussed in 15 minutes.    Follow-up: Return in about 6 months (around 02/26/2020).    Blane Ohara, MD

## 2019-08-27 ENCOUNTER — Other Ambulatory Visit: Payer: Self-pay | Admitting: Family Medicine

## 2019-08-27 DIAGNOSIS — E559 Vitamin D deficiency, unspecified: Secondary | ICD-10-CM

## 2019-08-27 LAB — COMPREHENSIVE METABOLIC PANEL
ALT: 14 IU/L (ref 0–32)
AST: 13 IU/L (ref 0–40)
Albumin/Globulin Ratio: 1.9 (ref 1.2–2.2)
Albumin: 4.2 g/dL (ref 3.7–4.7)
Alkaline Phosphatase: 49 IU/L (ref 48–121)
BUN/Creatinine Ratio: 13 (ref 12–28)
BUN: 10 mg/dL (ref 8–27)
Bilirubin Total: 0.5 mg/dL (ref 0.0–1.2)
CO2: 25 mmol/L (ref 20–29)
Calcium: 9.4 mg/dL (ref 8.7–10.3)
Chloride: 103 mmol/L (ref 96–106)
Creatinine, Ser: 0.8 mg/dL (ref 0.57–1.00)
GFR calc Af Amer: 81 mL/min/{1.73_m2} (ref >59)
GFR calc non Af Amer: 70 mL/min/{1.73_m2} (ref >59)
Globulin, Total: 2.2 g/dL (ref 1.5–4.5)
Glucose: 96 mg/dL (ref 65–99)
Potassium: 4.3 mmol/L (ref 3.5–5.2)
Sodium: 139 mmol/L (ref 134–144)
Total Protein: 6.4 g/dL (ref 6.0–8.5)

## 2019-08-27 LAB — CBC WITH DIFFERENTIAL/PLATELET
Basophils Absolute: 0 10*3/uL (ref 0.0–0.2)
Basos: 0 %
EOS (ABSOLUTE): 0.2 10*3/uL (ref 0.0–0.4)
Eos: 3 %
Hematocrit: 45.9 % (ref 34.0–46.6)
Hemoglobin: 14.9 g/dL (ref 11.1–15.9)
Immature Grans (Abs): 0 10*3/uL (ref 0.0–0.1)
Immature Granulocytes: 1 %
Lymphocytes Absolute: 1.3 10*3/uL (ref 0.7–3.1)
Lymphs: 20 %
MCH: 29.7 pg (ref 26.6–33.0)
MCHC: 32.5 g/dL (ref 31.5–35.7)
MCV: 91 fL (ref 79–97)
Monocytes Absolute: 0.6 10*3/uL (ref 0.1–0.9)
Monocytes: 8 %
Neutrophils Absolute: 4.6 10*3/uL (ref 1.4–7.0)
Neutrophils: 68 %
Platelets: 269 10*3/uL (ref 150–450)
RBC: 5.02 x10E6/uL (ref 3.77–5.28)
RDW: 13.1 % (ref 11.7–15.4)
WBC: 6.8 10*3/uL (ref 3.4–10.8)

## 2019-08-27 LAB — B12 AND FOLATE PANEL
Folate: 17.8 ng/mL (ref >3.0)
Vitamin B-12: 466 pg/mL (ref 232–1245)

## 2019-08-27 LAB — TSH: TSH: 2.6 u[IU]/mL (ref 0.450–4.500)

## 2019-08-29 LAB — SPECIMEN STATUS REPORT

## 2019-08-29 LAB — VITAMIN D 25 HYDROXY (VIT D DEFICIENCY, FRACTURES): Vit D, 25-Hydroxy: 24.8 ng/mL — ABNORMAL LOW (ref 30.0–100.0)

## 2019-08-30 ENCOUNTER — Other Ambulatory Visit: Payer: Self-pay

## 2019-08-30 MED ORDER — ERGOCALCIFEROL 1.25 MG (50000 UT) PO CAPS: 50000.0000 [IU] | ORAL_CAPSULE | ORAL | 0 refills | Status: DC

## 2019-10-04 DIAGNOSIS — M12811 Other specific arthropathies, not elsewhere classified, right shoulder: Secondary | ICD-10-CM | POA: Diagnosis not present

## 2019-10-04 DIAGNOSIS — M19011 Primary osteoarthritis, right shoulder: Secondary | ICD-10-CM | POA: Diagnosis not present

## 2019-10-04 DIAGNOSIS — M5030 Other cervical disc degeneration, unspecified cervical region: Secondary | ICD-10-CM | POA: Diagnosis not present

## 2019-10-31 ENCOUNTER — Ambulatory Visit (INDEPENDENT_AMBULATORY_CARE_PROVIDER_SITE_OTHER): Payer: Medicare PPO

## 2019-10-31 ENCOUNTER — Other Ambulatory Visit: Payer: Self-pay

## 2019-10-31 DIAGNOSIS — Z23 Encounter for immunization: Secondary | ICD-10-CM

## 2019-10-31 NOTE — Progress Notes (Signed)
   Covid-19 Vaccination Clinic  Name:  Rhonda Rivera    MRN: [id number] DOB: [birth year]  10/31/2019  [name] was observed post Covid-19 immunization for 15 minutes without incident. She was provided with Vaccine Information Sheet and instruction to access the V-Safe system.   [name] was instructed to call 911 with any severe reactions post vaccine: Marland Kitchen Difficulty breathing  . Swelling of face and throat  . A fast heartbeat  . A bad rash all over body  . Dizziness and weakness

## 2019-12-11 DIAGNOSIS — M9903 Segmental and somatic dysfunction of lumbar region: Secondary | ICD-10-CM | POA: Diagnosis not present

## 2019-12-11 DIAGNOSIS — M5136 Other intervertebral disc degeneration, lumbar region: Secondary | ICD-10-CM | POA: Diagnosis not present

## 2019-12-11 DIAGNOSIS — M9901 Segmental and somatic dysfunction of cervical region: Secondary | ICD-10-CM | POA: Diagnosis not present

## 2019-12-11 DIAGNOSIS — M9902 Segmental and somatic dysfunction of thoracic region: Secondary | ICD-10-CM | POA: Diagnosis not present

## 2019-12-12 ENCOUNTER — Other Ambulatory Visit: Payer: Self-pay | Admitting: Family Medicine

## 2019-12-12 ENCOUNTER — Telehealth: Payer: Self-pay

## 2019-12-12 DIAGNOSIS — M419 Scoliosis, unspecified: Secondary | ICD-10-CM

## 2019-12-12 MED ORDER — TRAMADOL HCL 50 MG PO TABS: 50.0000 mg | ORAL_TABLET | Freq: Two times a day (BID) | ORAL | 2 refills | Status: AC | PRN

## 2019-12-12 NOTE — Telephone Encounter (Signed)
Pt Husband sts that pt is in lots of pain and sts he was giving pt two aspirin in the morning and two at night for pain, but was not helping with her pain. Wanted to know if she could have something called in for pt. Dr. Sedalia Muta said to stop the Aspirin and called in tramadol 50 mg 2x daily as needed for pain. To make an appt if this does not help her pain.

## 2019-12-16 ENCOUNTER — Other Ambulatory Visit: Payer: Self-pay

## 2019-12-16 DIAGNOSIS — M545 Low back pain, unspecified: Secondary | ICD-10-CM

## 2019-12-16 DIAGNOSIS — M9901 Segmental and somatic dysfunction of cervical region: Secondary | ICD-10-CM | POA: Diagnosis not present

## 2019-12-16 DIAGNOSIS — M9903 Segmental and somatic dysfunction of lumbar region: Secondary | ICD-10-CM | POA: Diagnosis not present

## 2019-12-16 DIAGNOSIS — M9902 Segmental and somatic dysfunction of thoracic region: Secondary | ICD-10-CM | POA: Diagnosis not present

## 2019-12-16 DIAGNOSIS — M5136 Other intervertebral disc degeneration, lumbar region: Secondary | ICD-10-CM | POA: Diagnosis not present

## 2019-12-16 NOTE — Progress Notes (Signed)
j

## 2019-12-18 ENCOUNTER — Encounter: Payer: Self-pay | Admitting: Family Medicine

## 2019-12-18 ENCOUNTER — Ambulatory Visit (INDEPENDENT_AMBULATORY_CARE_PROVIDER_SITE_OTHER): Payer: Medicare PPO | Admitting: Family Medicine

## 2019-12-18 ENCOUNTER — Other Ambulatory Visit: Payer: Self-pay

## 2019-12-18 VITALS — BP 124/68 | HR 81 | Temp 97.2°F | Resp 18 | Ht 63.0 in | Wt 109.0 lb

## 2019-12-18 DIAGNOSIS — M542 Cervicalgia: Secondary | ICD-10-CM | POA: Diagnosis not present

## 2019-12-18 DIAGNOSIS — M5416 Radiculopathy, lumbar region: Secondary | ICD-10-CM

## 2019-12-18 MED ORDER — PREDNISONE 50 MG PO TABS: 50.0000 mg | ORAL_TABLET | Freq: Every day | ORAL | 0 refills | Status: DC

## 2019-12-18 NOTE — Patient Instructions (Signed)
STOP ASPIRIN.  START ON PREDNISONE 50 MG ONCE DAILY X 5 DAYS.  TAKE WITH FOOD IN AM.  ORDERING PHYSICAL THERAPY.

## 2019-12-18 NOTE — Progress Notes (Signed)
Subjective:  Patient ID: Rhonda Rivera, female    DOB: October 22, 1940  Age: 79 y.o. MRN: [id number]  Chief Complaint  Patient presents with  . Back Pain    HPI Patient has a history of chronic back pain for years. Summer of 2020 twisted back and had two compression fractures. No procedures done. Managed with pain medicine. The pain improved, but still had to be careful with movements. About two months ago you doing some work around the house and had gradual pain. Pt has seen chiropractor. Taking aspirin 325 mg four times per day and taking hydrocodone/apap one twice a day. Helps some. Able to sleep.   Patient also says she has chronic neck pain. At times her arm goes numb.   Current Outpatient Medications on File Prior to Visit  Medication Sig Dispense Refill  . Ascorbic Acid (VITAMIN C) 1000 MG tablet Take 1,000 mg by mouth daily.    . Cholecalciferol (VITAMIN D) 2000 units CAPS Take 1 capsule by mouth daily.    . Coenzyme Q10 (CO Q 10) 100 MG CAPS Take 1 capsule by mouth daily.    . ergocalciferol (VITAMIN D2) 1.25 MG (50000 UT) capsule Take 1 capsule (50,000 Units total) by mouth once a week. 12 capsule 0  . GRAPE SEED EXTRACT PO Take 1 tablet by mouth daily.    . magnesium 30 MG tablet Take 30 mg by mouth daily.    . potassium chloride (MICRO-K) 10 MEQ CR capsule Take 1 capsule (10 mEq total) by mouth daily. 90 capsule 0  . traMADol (ULTRAM) 50 MG tablet Take 1 tablet (50 mg total) by mouth 2 (two) times daily as needed for severe pain. 60 tablet 2   Current Facility-Administered Medications on File Prior to Visit  Medication Dose Route Frequency Provider Last Rate Last Admin  . triamcinolone acetonide (KENALOG-40) injection 40 mg  40 mg Other Once Blane Ohara, MD       Past Medical History:  Diagnosis Date  . Cardiac murmur 07/13/2017  . GERD (gastroesophageal reflux disease) 07/13/2017  . GERD (gastroesophageal reflux disease) 07/13/2017  . Hyperlipidemia 07/13/2017  .  Osteoporosis 07/13/2017  . Vitamin D deficiency 07/13/2017  . Wedge compression fracture of lumbar vertebra (HCC) 04/16/2019   Past Surgical History:  Procedure Laterality Date  . ABDOMINAL HYSTERECTOMY    . LEG SURGERY      Family History  Problem Relation Age of Onset  . Heart attack Father    Social History   Socioeconomic History  . Marital status: Married    Spouse name: Not on file  . Number of children: 2  . Years of education: Not on file  . Highest education level: Not on file  Occupational History  . Occupation: Retired  Tobacco Use  . Smoking status: Never Smoker  . Smokeless tobacco: Never Used  Vaping Use  . Vaping Use: Never used  Substance and Sexual Activity  . Alcohol use: Never  . Drug use: Never  . Sexual activity: Yes    Partners: Male  Other Topics Concern  . Not on file  Social History Narrative  . Not on file   Social Determinants of Health   Financial Resource Strain:   . Difficulty of Paying Living Expenses: Not on file  Food Insecurity:   . Worried About Programme researcher, broadcasting/film/video in the Last Year: Not on file  . Ran Out of Food in the Last Year: Not on file  Transportation Needs:   .  Lack of Transportation (Medical): Not on file  . Lack of Transportation (Non-Medical): Not on file  Physical Activity:   . Days of Exercise per Week: Not on file  . Minutes of Exercise per Session: Not on file  Stress:   . Feeling of Stress : Not on file  Social Connections:   . Frequency of Communication with Friends and Family: Not on file  . Frequency of Social Gatherings with Friends and Family: Not on file  . Attends Religious Services: Not on file  . Active Member of Clubs or Organizations: Not on file  . Attends Banker Meetings: Not on file  . Marital Status: Not on file    Review of Systems  Constitutional: Negative for chills, fatigue and fever.  HENT: Positive for tinnitus. Negative for congestion, ear pain and sore throat.    Respiratory: Negative for cough and shortness of breath.   Cardiovascular: Negative for chest pain.  Gastrointestinal: Negative for abdominal pain, constipation, diarrhea, nausea and vomiting.  Genitourinary: Negative for dysuria and frequency.  Musculoskeletal: Positive for arthralgias, back pain and myalgias.  Skin: Negative for rash.  Neurological: Negative for headaches.  Psychiatric/Behavioral: Negative for dysphoric mood. The patient is not nervous/anxious.      Objective:  BP 124/68   Pulse 81   Temp (!) 97.2 F (36.2 C)   Resp 18   Ht 5\' 3"  (1.6 m)   Wt 109 lb (49.4 kg)   SpO2 99%   BMI 19.31 kg/m   BP/Weight 12/18/2019 08/26/2019 05/30/2019  Systolic BP 124 120 122  Diastolic BP 68 64 76  Wt. (Lbs) 109 112.6 116  BMI 19.31 21.28 22.77    Physical Exam Vitals reviewed.  Constitutional:      Appearance: Normal appearance. She is normal weight.  Cardiovascular:     Rate and Rhythm: Normal rate and regular rhythm.     Pulses: Normal pulses.     Heart sounds: Normal heart sounds.  Pulmonary:     Effort: Pulmonary effort is normal.     Breath sounds: Normal breath sounds.  Abdominal:     General: Bowel sounds are normal.     Palpations: Abdomen is soft.     Tenderness: There is no abdominal tenderness.  Musculoskeletal:        General: Tenderness (over upper lumbar paraspinal muscles nontender over vertebrae. Negative SLR, but she reports she moves a certain way and gets shooting pain down her right leg.  Rom is good.. Neck is nontender.07/30/2019) present.  Neurological:     Mental Status: She is alert and oriented to person, place, and time.  Psychiatric:        Mood and Affect: Mood normal.        Behavior: Behavior normal.     Lab Results  Component Value Date   WBC 6.8 08/26/2019   HGB 14.9 08/26/2019   HCT 45.9 08/26/2019   PLT 269 08/26/2019   GLUCOSE 96 08/26/2019   CHOL 204 (H) 04/16/2019   TRIG 55 04/16/2019   HDL 78 04/16/2019   LDLCALC 116 (H)  04/16/2019   ALT 14 08/26/2019   AST 13 08/26/2019   NA 139 08/26/2019   K 4.3 08/26/2019   CL 103 08/26/2019   CREATININE 0.80 08/26/2019   BUN 10 08/26/2019   CO2 25 08/26/2019   TSH 2.600 08/26/2019   INR 0.9 03/02/2007      Assessment & Plan:   1. Cervical pain Recommend use of tylenol. Prednisone  given. No aspirin or nsaids while taking prednisone.  - Ambulatory referral to Physical Therapy  2. Lumbar radiculopathy - predniSONE (DELTASONE) 50 MG tablet; Take 1 tablet (50 mg total) by mouth daily with breakfast.  Dispense: 5 tablet; Refill: 0 - Ambulatory referral to Physical Therapy    Meds ordered this encounter  Medications  . predniSONE (DELTASONE) 50 MG tablet    Sig: Take 1 tablet (50 mg total) by mouth daily with breakfast.    Dispense:  5 tablet    Refill:  0    Orders Placed This Encounter  Procedures  . Ambulatory referral to Physical Therapy    Follow-up: Return in about 6 weeks (around 01/29/2020).  An After Visit Summary was printed and given to the patient.  Blane Ohara, MD [facility] [phone]

## 2019-12-25 DIAGNOSIS — M5416 Radiculopathy, lumbar region: Secondary | ICD-10-CM | POA: Diagnosis not present

## 2019-12-25 DIAGNOSIS — M542 Cervicalgia: Secondary | ICD-10-CM | POA: Diagnosis not present

## 2020-01-03 DIAGNOSIS — M5416 Radiculopathy, lumbar region: Secondary | ICD-10-CM | POA: Diagnosis not present

## 2020-01-03 DIAGNOSIS — M542 Cervicalgia: Secondary | ICD-10-CM | POA: Diagnosis not present

## 2020-01-10 DIAGNOSIS — M542 Cervicalgia: Secondary | ICD-10-CM | POA: Diagnosis not present

## 2020-01-10 DIAGNOSIS — M5416 Radiculopathy, lumbar region: Secondary | ICD-10-CM | POA: Diagnosis not present

## 2020-01-20 ENCOUNTER — Other Ambulatory Visit: Payer: Self-pay | Admitting: Family Medicine

## 2020-01-20 ENCOUNTER — Telehealth: Payer: Self-pay

## 2020-01-20 DIAGNOSIS — F0390 Unspecified dementia without behavioral disturbance: Secondary | ICD-10-CM

## 2020-01-20 DIAGNOSIS — M5416 Radiculopathy, lumbar region: Secondary | ICD-10-CM

## 2020-01-20 MED ORDER — HYDROCODONE-ACETAMINOPHEN 5-325 MG PO TABS
1.0000 | ORAL_TABLET | Freq: Two times a day (BID) | ORAL | 0 refills | Status: DC | PRN
Start: 2020-01-20 — End: 2020-01-29

## 2020-01-20 NOTE — Telephone Encounter (Signed)
Mr. Ringold called with concerns about Rhonda Rivera.  Her back pain is worse.  She is experiencing pain in her neck and back.  She did get physical therapy for 4 visits with no relief.  She was seeing Danae [facility]. [facility] in [place] with no relief.  Dr. Sedalia Muta is going to review records and decide on plan.

## 2020-01-20 NOTE — Telephone Encounter (Signed)
Rhonda Rivera spoke with pt and her husband. Pt is taking aspirin 325 mg one three times a day and tylenol 1 gm up to four times a day. Pt is having severe back pain. She has not gone to the ED. She is also having worsening memory issues. She went to physical therapy four times, but it did not help. She took prednisone course which did not help. Ordering a mri of lumbar spine and mri of brain.

## 2020-01-20 NOTE — Telephone Encounter (Signed)
Pts husband walked in the office this morning requesting to talk with Dr. Sedalia Muta or her nurse. Regarding his wife. He stated that he is unable to get her out of the bed, she has been in bed for about four days now in severe pain. I was unable to get Dr. Sedalia Muta or her nurse, so I spoke with Leotis Shames, MA regarding the information that was stated to me by the pts husband. She advised me to tell him that he needs to call 911, so the pt can be transported to the ED.  Pts husband was notified that if his wife is in severe pain and he is unable to get her up then he needs to call 911 so she may be transported to the ED. Mr. Hagadorn then processed to ask about her being referred somewhere for a possible pinched nerve. I told him she would need to be seen before we could refer her to somewhere. He then stated he just doesn't know what to do, he doesn't want her to go the [facility]. I told Mr. Bera again he needs to call 911 so EMS can come out to transport her to a hospital. I also told him he needs to state to EMS that he doesn't want her to go the [facility].

## 2020-01-21 ENCOUNTER — Telehealth: Payer: Self-pay

## 2020-01-21 NOTE — Telephone Encounter (Signed)
Patient called seeking advise as to rather or not she can take tylenol in between her hydrocodone doses. Per Dr. Sedalia Muta patient may take up to 4 doses of tylenol. Patient informed that she may not exceed more than 4 doses in a 24 hour period and expressed understanding. Patient requested that her niece be informed of this information, she also expressed understanding.

## 2020-01-22 ENCOUNTER — Telehealth: Payer: Self-pay

## 2020-01-22 ENCOUNTER — Other Ambulatory Visit: Payer: Self-pay | Admitting: Family Medicine

## 2020-01-22 DIAGNOSIS — M5416 Radiculopathy, lumbar region: Secondary | ICD-10-CM

## 2020-01-22 DIAGNOSIS — F0390 Unspecified dementia without behavioral disturbance: Secondary | ICD-10-CM

## 2020-01-22 NOTE — Telephone Encounter (Signed)
Dr. Cox/Carolyn please advise.  Pt's husband came in asking about an MRI that was suppose to be ordered. I never seen any orders put in on my workquw but I do see in the notes and imaging orders an something about a MRI I was just checking to make sure that this wasn't already ordered or done, she couldn't remember and it is a MR of the Brain w/w/o contrast and the a MRI of Lumbar spine without contrast right?

## 2020-01-28 ENCOUNTER — Other Ambulatory Visit: Payer: Self-pay | Admitting: Legal Medicine

## 2020-01-28 ENCOUNTER — Telehealth: Payer: Self-pay

## 2020-01-28 DIAGNOSIS — J0130 Acute sphenoidal sinusitis, unspecified: Secondary | ICD-10-CM | POA: Diagnosis not present

## 2020-01-28 DIAGNOSIS — I6782 Cerebral ischemia: Secondary | ICD-10-CM | POA: Diagnosis not present

## 2020-01-28 DIAGNOSIS — F989 Unspecified behavioral and emotional disorders with onset usually occurring in childhood and adolescence: Secondary | ICD-10-CM | POA: Diagnosis not present

## 2020-01-28 DIAGNOSIS — G319 Degenerative disease of nervous system, unspecified: Secondary | ICD-10-CM | POA: Diagnosis not present

## 2020-01-28 DIAGNOSIS — M5416 Radiculopathy, lumbar region: Secondary | ICD-10-CM | POA: Diagnosis not present

## 2020-01-28 DIAGNOSIS — S32000D Wedge compression fracture of unspecified lumbar vertebra, subsequent encounter for fracture with routine healing: Secondary | ICD-10-CM

## 2020-01-28 DIAGNOSIS — M545 Low back pain, unspecified: Secondary | ICD-10-CM | POA: Diagnosis not present

## 2020-01-28 NOTE — Telephone Encounter (Signed)
Radiology called to report that patient MRI Lumbar Spine showed:  "Acute/ealry subacute L1 vertebrale compression fracture with progressive height loss, now at 70%. Edema present in majority of vertebrale body extending into the posterior element. Acute/ealry subacute L1 vertebrale compression fracture with progressive height loss, now at 50%. Edema present in majority of vertebrale body extending into the posterior element."   Eber Jones will be printing the report.

## 2020-01-29 ENCOUNTER — Telehealth: Payer: Self-pay

## 2020-01-29 ENCOUNTER — Other Ambulatory Visit: Payer: Self-pay | Admitting: Legal Medicine

## 2020-01-29 DIAGNOSIS — S32000D Wedge compression fracture of unspecified lumbar vertebra, subsequent encounter for fracture with routine healing: Secondary | ICD-10-CM

## 2020-01-29 DIAGNOSIS — M5416 Radiculopathy, lumbar region: Secondary | ICD-10-CM

## 2020-01-29 MED ORDER — OXYCODONE-ACETAMINOPHEN 10-325 MG PO TABS: 1.0000 | ORAL_TABLET | Freq: Three times a day (TID) | ORAL | 0 refills | Status: DC | PRN

## 2020-01-29 MED ORDER — NALOXEGOL OXALATE 12.5 MG PO TABS: 12.5000 mg | ORAL_TABLET | Freq: Every day | ORAL | 2 refills | Status: DC

## 2020-01-29 NOTE — Telephone Encounter (Signed)
Neurosurgery referral placed.  

## 2020-01-29 NOTE — Telephone Encounter (Signed)
Patient was informed for Lauren. She will try metamucil.

## 2020-01-29 NOTE — Telephone Encounter (Signed)
Patient is taking Norco twice a day however she mentioned that medication is not helping and also she is constipated due to the medication.

## 2020-01-29 NOTE — Telephone Encounter (Signed)
I called in oxycodone for pain.  Need to be on metamucil regularly.I have one box of linzess for constipation, lp

## 2020-01-30 ENCOUNTER — Other Ambulatory Visit: Payer: Self-pay

## 2020-01-30 DIAGNOSIS — S32000D Wedge compression fracture of unspecified lumbar vertebra, subsequent encounter for fracture with routine healing: Secondary | ICD-10-CM

## 2020-01-30 NOTE — Telephone Encounter (Signed)
Sent to [facility] lp

## 2020-02-03 ENCOUNTER — Other Ambulatory Visit: Payer: Self-pay | Admitting: Family Medicine

## 2020-02-03 DIAGNOSIS — F0390 Unspecified dementia without behavioral disturbance: Secondary | ICD-10-CM

## 2020-02-03 NOTE — Progress Notes (Signed)
No stroke but chronic vascular changes, possible old stroke involving mid brain lp

## 2020-02-03 NOTE — Progress Notes (Signed)
No stroke,  lp

## 2020-02-04 ENCOUNTER — Ambulatory Visit: Payer: Medicare PPO | Admitting: Family Medicine

## 2020-02-07 DIAGNOSIS — S32010A Wedge compression fracture of first lumbar vertebra, initial encounter for closed fracture: Secondary | ICD-10-CM | POA: Diagnosis not present

## 2020-02-07 DIAGNOSIS — M542 Cervicalgia: Secondary | ICD-10-CM | POA: Diagnosis not present

## 2020-02-13 ENCOUNTER — Other Ambulatory Visit: Payer: Self-pay | Admitting: Family Medicine

## 2020-02-13 MED ORDER — OXYCODONE-ACETAMINOPHEN 5-325 MG PO TABS: 1.0000 | ORAL_TABLET | ORAL | 0 refills | Status: DC | PRN

## 2020-02-21 ENCOUNTER — Telehealth: Payer: Self-pay | Admitting: Cardiology

## 2020-02-21 NOTE — Telephone Encounter (Signed)
Spoke to the patient just now and let her know that I have her scheduled for Tuesday the 18th at 3pm but she can come with her husband and we should be able to see her earlier in the morning with him.   She verbalizes understanding and thank me for the call back.

## 2020-02-21 NOTE — Telephone Encounter (Signed)
Fayrene Fearing is calling stating he received a letter to schedule his wife an appointment and is requesting she be worked in on 03/13/20 the same day he is scheduled. Please advise.

## 2020-02-25 NOTE — Progress Notes (Signed)
Cancelled.  

## 2020-02-26 ENCOUNTER — Other Ambulatory Visit: Payer: Self-pay

## 2020-02-26 ENCOUNTER — Ambulatory Visit (INDEPENDENT_AMBULATORY_CARE_PROVIDER_SITE_OTHER): Payer: Medicare PPO | Admitting: Family Medicine

## 2020-02-26 DIAGNOSIS — M542 Cervicalgia: Secondary | ICD-10-CM

## 2020-02-26 DIAGNOSIS — F0390 Unspecified dementia without behavioral disturbance: Secondary | ICD-10-CM

## 2020-02-26 DIAGNOSIS — K219 Gastro-esophageal reflux disease without esophagitis: Secondary | ICD-10-CM

## 2020-02-26 DIAGNOSIS — E782 Mixed hyperlipidemia: Secondary | ICD-10-CM

## 2020-02-28 NOTE — Progress Notes (Signed)
Subjective:  Patient ID: Rhonda Rivera, female    DOB: Jul 30, 1940  Age: 80 y.o. MRN: [id number]  Chief Complaint  Patient presents with  . Hyperlipidemia  . Gastroesophageal Reflux    HPI Back pain: Patient has severe scoliosis.  She takes adult aspirin 325 2 twice daily.  Patient seen the neurosurgeon.  He is unwilling to do any surgery secondary to severe osteoporosis.  Her bone density had a T score of -4.2. Right shoulder pain: This is been hurting for 2 to 3 years.  She has fallen on it in the past. Memory loss: Her husband is very concerned about her memory.  The patient is a very intelligent woman with a history of being a principal.  She is also a Systems analyst.  She does not feel like she is very forgetful other than occasionally names. She wishes to use herbals. Osteoporosis: Patient is currently taking calcium with vitamin D.  She did not tolerate alendronate although she only tried 1 dose.  She is willing to try Prolia.  Her last bone density was August 2020.  T score was -4.2.  Current Outpatient Medications on File Prior to Visit  Medication Sig Dispense Refill  . Ascorbic Acid (VITAMIN C) 1000 MG tablet Take 1,000 mg by mouth daily.    Marland Kitchen aspirin 325 MG EC tablet Take 650 mg by mouth in the morning and at bedtime.    . Cholecalciferol (VITAMIN D) 2000 units CAPS Take 1 capsule by mouth daily.    . Coenzyme Q10 (CO Q 10) 100 MG CAPS Take 1 capsule by mouth daily.    . potassium chloride (MICRO-K) 10 MEQ CR capsule Take 1 capsule (10 mEq total) by mouth daily. 90 capsule 0  . GRAPE SEED EXTRACT PO Take 1 tablet by mouth daily. (Patient not taking: Reported on 03/03/2020)    . magnesium 30 MG tablet Take 30 mg by mouth daily. (Patient not taking: Reported on 03/03/2020)     Current Facility-Administered Medications on File Prior to Visit  Medication Dose Route Frequency Provider Last Rate Last Admin  . triamcinolone acetonide (KENALOG-40) injection 40 mg  40 mg Other Once  Blane Ohara, MD       Past Medical History:  Diagnosis Date  . Cardiac murmur 07/13/2017  . GERD (gastroesophageal reflux disease) 07/13/2017  . GERD (gastroesophageal reflux disease) 07/13/2017  . Hyperlipidemia 07/13/2017  . Osteoporosis 07/13/2017  . Vitamin D deficiency 07/13/2017  . Wedge compression fracture of lumbar vertebra (HCC) 04/16/2019   Past Surgical History:  Procedure Laterality Date  . ABDOMINAL HYSTERECTOMY    . LEG SURGERY      Family History  Problem Relation Age of Onset  . Heart attack Father    Social History   Socioeconomic History  . Marital status: Married    Spouse name: Not on file  . Number of children: 2  . Years of education: Not on file  . Highest education level: Not on file  Occupational History  . Occupation: Retired  Tobacco Use  . Smoking status: Never Smoker  . Smokeless tobacco: Never Used  Vaping Use  . Vaping Use: Never used  Substance and Sexual Activity  . Alcohol use: Never  . Drug use: Never  . Sexual activity: Yes    Partners: Male  Other Topics Concern  . Not on file  Social History Narrative  . Not on file   Social Determinants of Health   Financial Resource Strain: Not on file  Food Insecurity: Not on file  Transportation Needs: Not on file  Physical Activity: Not on file  Stress: Not on file  Social Connections: Not on file    Review of Systems  Constitutional: Negative for chills, fatigue and fever.  HENT: Negative for congestion, ear pain, rhinorrhea and sore throat.   Respiratory: Negative for cough and shortness of breath.   Cardiovascular: Negative for chest pain.  Gastrointestinal: Positive for constipation. Negative for abdominal pain, diarrhea, nausea and vomiting.  Genitourinary: Negative for dysuria and urgency.  Musculoskeletal: Positive for arthralgias, back pain and myalgias.  Neurological: Negative for dizziness, weakness, light-headedness and headaches.  Psychiatric/Behavioral: Negative for  dysphoric mood. The patient is not nervous/anxious.      Objective:  BP 116/70   Pulse 72   Temp (!) 97.2 F (36.2 C)   Resp 18   Ht 5\' 3"  (1.6 m)   Wt 100 lb 9.6 oz (45.6 kg)   BMI 17.82 kg/m   BP/Weight 03/03/2020 12/18/2019 08/26/2019  Systolic BP 116 124 120  Diastolic BP 70 68 64  Wt. (Lbs) 100.6 109 112.6  BMI 17.82 19.31 21.28    Physical Exam Vitals reviewed.  Constitutional:      Appearance: Normal appearance.     Comments: Thin. Posture is terrible with kyphosis and scoliosis.   Neck:     Vascular: No carotid bruit.  Cardiovascular:     Rate and Rhythm: Normal rate and regular rhythm.     Heart sounds: Murmur heard.    Pulmonary:     Effort: Pulmonary effort is normal.     Breath sounds: Normal breath sounds.  Abdominal:     Tenderness: There is no abdominal tenderness.  Musculoskeletal:     Comments: Abnormal rom of rt shoulder. Limited abduction, intermal and external rotation.  Left shoulder is normal.  Severe scoliosis.  Muscle spasms on right.   Neurological:     Mental Status: She is alert.  Psychiatric:        Mood and Affect: Mood normal.        Behavior: Behavior normal.   MMSE 25/30.  Diabetic Foot Exam - Simple   No data filed      Lab Results  Component Value Date   WBC 6.8 08/26/2019   HGB 14.9 08/26/2019   HCT 45.9 08/26/2019   PLT 269 08/26/2019   GLUCOSE 96 08/26/2019   CHOL 204 (H) 04/16/2019   TRIG 55 04/16/2019   HDL 78 04/16/2019   LDLCALC 116 (H) 04/16/2019   ALT 14 08/26/2019   AST 13 08/26/2019   NA 139 08/26/2019   K 4.3 08/26/2019   CL 103 08/26/2019   CREATININE 0.80 08/26/2019   BUN 10 08/26/2019   CO2 25 08/26/2019   TSH 2.600 08/26/2019   INR 0.9 03/02/2007      Assessment & Plan:  1. Vascular dementia without behavioral disturbance (HCC) She is currently taking aspirin daily.  Cholesterol has been good in the past but will recheck today. Consider Aricept.  Patient prefers to use herbals.   Currently refuses alternatives.  2. Mixed hyperlipidemia - CBC with Differential/Platelet - Comprehensive metabolic panel - Lipid panel  3. Chronic right shoulder pain Continue aspirin.  Continue tylenol  4. Age-related osteoporosis without current pathological fracture Recommend prolia. Will work on getting approved.  Continue calcium with vitamin D. - AMB Referral to [facility]  5. Severe protein-calorie malnutrition (HCC) Pt grazes. Eats small amounts thorugh the day. Pt has always  been thin.   6. Scoliosis: Chronic back pain.  Continue aspirin.  May use addition of tylenol. Samples given.      Follow-up: Return in about 6 months (around 08/31/2020) for fasting.  An After Visit Summary was printed and given to the patient.  Blane Ohara, MD  Family Practice [phone]

## 2020-03-03 ENCOUNTER — Telehealth: Payer: Self-pay | Admitting: Family Medicine

## 2020-03-03 ENCOUNTER — Ambulatory Visit (INDEPENDENT_AMBULATORY_CARE_PROVIDER_SITE_OTHER): Payer: Medicare PPO | Admitting: Family Medicine

## 2020-03-03 ENCOUNTER — Other Ambulatory Visit: Payer: Self-pay

## 2020-03-03 ENCOUNTER — Encounter: Payer: Self-pay | Admitting: Family Medicine

## 2020-03-03 VITALS — BP 116/70 | HR 72 | Temp 97.2°F | Resp 18 | Ht 63.0 in | Wt 100.6 lb

## 2020-03-03 DIAGNOSIS — G8929 Other chronic pain: Secondary | ICD-10-CM

## 2020-03-03 DIAGNOSIS — K219 Gastro-esophageal reflux disease without esophagitis: Secondary | ICD-10-CM

## 2020-03-03 DIAGNOSIS — M4125 Other idiopathic scoliosis, thoracolumbar region: Secondary | ICD-10-CM

## 2020-03-03 DIAGNOSIS — M810 Age-related osteoporosis without current pathological fracture: Secondary | ICD-10-CM | POA: Diagnosis not present

## 2020-03-03 DIAGNOSIS — M546 Pain in thoracic spine: Secondary | ICD-10-CM

## 2020-03-03 DIAGNOSIS — E782 Mixed hyperlipidemia: Secondary | ICD-10-CM

## 2020-03-03 DIAGNOSIS — E43 Unspecified severe protein-calorie malnutrition: Secondary | ICD-10-CM

## 2020-03-03 DIAGNOSIS — K2100 Gastro-esophageal reflux disease with esophagitis, without bleeding: Secondary | ICD-10-CM

## 2020-03-03 DIAGNOSIS — F0390 Unspecified dementia without behavioral disturbance: Secondary | ICD-10-CM

## 2020-03-03 DIAGNOSIS — F0150 Vascular dementia without behavioral disturbance: Secondary | ICD-10-CM | POA: Diagnosis not present

## 2020-03-03 DIAGNOSIS — M25511 Pain in right shoulder: Secondary | ICD-10-CM | POA: Diagnosis not present

## 2020-03-03 NOTE — Chronic Care Management (AMB) (Signed)
  Chronic Care Management   Note  03/03/2020 Name: Rhonda Rivera MRN: [id number] DOB: [birth year]  Rhonda Rivera is a 80 y.o. year old female who is a primary care patient of Cox, Kirsten, MD. I reached out to [name] by phone today in response to a referral sent by Rhonda Rivera's PCP, Cox, Kirsten, MD.   Rhonda Rivera was given information about Chronic Care Management services today including:  1. CCM service includes personalized support from designated clinical staff supervised by her physician, including individualized plan of care and coordination with other care providers 2. 24/7 contact phone numbers for assistance for urgent and routine care needs. 3. Service will only be billed when office clinical staff spend 20 minutes or more in a month to coordinate care. 4. Only one practitioner may furnish and bill the service in a calendar month. 5. The patient may stop CCM services at any time (effective at the end of the month) by phone call to the office staff.   Rhonda Rivera verbally agreed to assistance and services provided by embedded care coordination/care management team today.  Follow up plan:   Rhonda Rivera  Upstream Scheduler

## 2020-03-04 LAB — COMPREHENSIVE METABOLIC PANEL
ALT: 7 IU/L (ref 0–32)
AST: 8 IU/L (ref 0–40)
Albumin/Globulin Ratio: 1.9 (ref 1.2–2.2)
Albumin: 4.4 g/dL (ref 3.7–4.7)
Alkaline Phosphatase: 66 IU/L (ref 44–121)
BUN/Creatinine Ratio: 13 (ref 12–28)
BUN: 9 mg/dL (ref 8–27)
Bilirubin Total: 0.4 mg/dL (ref 0.0–1.2)
CO2: 22 mmol/L (ref 20–29)
Calcium: 9.7 mg/dL (ref 8.7–10.3)
Chloride: 102 mmol/L (ref 96–106)
Creatinine, Ser: 0.71 mg/dL (ref 0.57–1.00)
GFR calc Af Amer: 94 mL/min/{1.73_m2} (ref >59)
GFR calc non Af Amer: 81 mL/min/{1.73_m2} (ref >59)
Globulin, Total: 2.3 g/dL (ref 1.5–4.5)
Glucose: 99 mg/dL (ref 65–99)
Potassium: 4.6 mmol/L (ref 3.5–5.2)
Sodium: 141 mmol/L (ref 134–144)
Total Protein: 6.7 g/dL (ref 6.0–8.5)

## 2020-03-04 LAB — CBC WITH DIFFERENTIAL/PLATELET
Basophils Absolute: 0 10*3/uL (ref 0.0–0.2)
Basos: 1 %
EOS (ABSOLUTE): 0.2 10*3/uL (ref 0.0–0.4)
Eos: 4 %
Hematocrit: 44.4 % (ref 34.0–46.6)
Hemoglobin: 14.8 g/dL (ref 11.1–15.9)
Immature Grans (Abs): 0 10*3/uL (ref 0.0–0.1)
Immature Granulocytes: 0 %
Lymphocytes Absolute: 1.4 10*3/uL (ref 0.7–3.1)
Lymphs: 25 %
MCH: 29.8 pg (ref 26.6–33.0)
MCHC: 33.3 g/dL (ref 31.5–35.7)
MCV: 90 fL (ref 79–97)
Monocytes Absolute: 0.4 10*3/uL (ref 0.1–0.9)
Monocytes: 7 %
Neutrophils Absolute: 3.7 10*3/uL (ref 1.4–7.0)
Neutrophils: 63 %
Platelets: 307 10*3/uL (ref 150–450)
RBC: 4.96 x10E6/uL (ref 3.77–5.28)
RDW: 14.5 % (ref 11.7–15.4)
WBC: 5.7 10*3/uL (ref 3.4–10.8)

## 2020-03-04 LAB — LIPID PANEL
Chol/HDL Ratio: 3.2 ratio (ref 0.0–4.4)
Cholesterol, Total: 234 mg/dL — ABNORMAL HIGH (ref 100–199)
HDL: 74 mg/dL (ref >39)
LDL Chol Calc (NIH): 148 mg/dL — ABNORMAL HIGH (ref 0–99)
Triglycerides: 69 mg/dL (ref 0–149)
VLDL Cholesterol Cal: 12 mg/dL (ref 5–40)

## 2020-03-04 LAB — CARDIOVASCULAR RISK ASSESSMENT

## 2020-03-05 ENCOUNTER — Other Ambulatory Visit: Payer: Self-pay

## 2020-03-05 MED ORDER — ATORVASTATIN CALCIUM 20 MG PO TABS: 20.0000 mg | ORAL_TABLET | Freq: Every day | ORAL | 0 refills | Status: DC

## 2020-03-12 NOTE — Progress Notes (Deleted)
Cardiology Office Note:    Date:  03/12/2020   ID:  [name], DOB Sep 12, 1940, MRN [id number]  PCP:  Blane Ohara, MD  Cardiologist:  Norman Herrlich, MD    Referring MD: Blane Ohara, MD    ASSESSMENT:    No diagnosis found. PLAN:    In order of problems listed above:  1. ***   Next appointment: ***   Medication Adjustments/Labs and Tests Ordered: Current medicines are reviewed at length with the patient today.  Concerns regarding medicines are outlined above.  No orders of the defined types were placed in this encounter.  No orders of the defined types were placed in this encounter.   No chief complaint on file.   History of Present Illness:    Rhonda Rivera is a 80 y.o. female with a hx of aortic stenosis and atrial premature beats last seen 10/04/2017.  The last echocardiogram from 09/01/2017 showed aortic stenosis that was defined is moderate to severe peak and mean gradients 62 and 33 mmHg valve area 0.6 cm and a VTI ratio of 0.25.  Other problems include vascular dementia hyperlipidemia osteoporosis and severe protein calorie malnutrition. Compliance with diet, lifestyle and medications: *** Past Medical History:  Diagnosis Date  . Cardiac murmur 07/13/2017  . GERD (gastroesophageal reflux disease) 07/13/2017  . GERD (gastroesophageal reflux disease) 07/13/2017  . Hyperlipidemia 07/13/2017  . Osteoporosis 07/13/2017  . Vitamin D deficiency 07/13/2017  . Wedge compression fracture of lumbar vertebra (HCC) 04/16/2019    Past Surgical History:  Procedure Laterality Date  . ABDOMINAL HYSTERECTOMY    . LEG SURGERY      Current Medications: No outpatient medications have been marked as taking for the 03/13/20 encounter (Appointment) with Baldo Daub, MD.   Current Facility-Administered Medications for the 03/13/20 encounter (Appointment) with Baldo Daub, MD  Medication  . triamcinolone acetonide (KENALOG-40) injection 40 mg     Allergies:    Celebrex [celecoxib] and Penicillins   Social History   Socioeconomic History  . Marital status: Married    Spouse name: Not on file  . Number of children: 2  . Years of education: Not on file  . Highest education level: Not on file  Occupational History  . Occupation: Retired  Tobacco Use  . Smoking status: Never Smoker  . Smokeless tobacco: Never Used  Vaping Use  . Vaping Use: Never used  Substance and Sexual Activity  . Alcohol use: Never  . Drug use: Never  . Sexual activity: Yes    Partners: Male  Other Topics Concern  . Not on file  Social History Narrative  . Not on file   Social Determinants of Health   Financial Resource Strain: Not on file  Food Insecurity: Not on file  Transportation Needs: Not on file  Physical Activity: Not on file  Stress: Not on file  Social Connections: Not on file     Family History: The patient's ***family history includes Heart attack in her father. ROS:   Please see the history of present illness.    All other systems reviewed and are negative.  EKGs/Labs/Other Studies Reviewed:    The following studies were reviewed today:  EKG:  EKG ordered today and personally reviewed.  The ekg ordered today demonstrates ***  Recent Labs: 08/26/2019: TSH 2.600 03/03/2020: ALT 7; BUN 9; Creatinine, Ser 0.71; Hemoglobin 14.8; Platelets 307; Potassium 4.6; Sodium 141  Recent Lipid Panel    Component Value Date/Time   CHOL 234 (H)  03/03/2020 0836   TRIG 69 03/03/2020 0836   HDL 74 03/03/2020 0836   CHOLHDL 3.2 03/03/2020 0836   LDLCALC 148 (H) 03/03/2020 0836    Physical Exam:    VS:  There were no vitals taken for this visit.    Wt Readings from Last 3 Encounters:  03/03/20 100 lb 9.6 oz (45.6 kg)  12/18/19 109 lb (49.4 kg)  08/26/19 112 lb 9.6 oz (51.1 kg)     GEN: *** Well nourished, well developed in no acute distress HEENT: Normal NECK: No JVD; No carotid bruits LYMPHATICS: No lymphadenopathy CARDIAC: ***RRR, no  murmurs, rubs, gallops RESPIRATORY:  Clear to auscultation without rales, wheezing or rhonchi  ABDOMEN: Soft, non-tender, non-distended MUSCULOSKELETAL:  No edema; No deformity  SKIN: Warm and dry NEUROLOGIC:  Alert and oriented x 3 PSYCHIATRIC:  Normal affect    Signed, Norman Herrlich, MD  03/12/2020 8:15 AM     [facility]

## 2020-03-13 ENCOUNTER — Ambulatory Visit: Payer: Medicare PPO | Admitting: Cardiology

## 2020-03-31 ENCOUNTER — Telehealth: Payer: Self-pay

## 2020-03-31 NOTE — Telephone Encounter (Signed)
Look at my last note for recommendations in plan. Kc

## 2020-03-31 NOTE — Telephone Encounter (Signed)
You last saw her on 2.8.22 and discussed surgery with her and the neurosurgeons suggestions. May you can make some sense of this lp

## 2020-03-31 NOTE — Telephone Encounter (Signed)
Pt left VM concerning MRI findings.   Returned call as documentation states findings were already given to pt by Dr. Marina Goodell. Pt states Dr. Marina Goodell did speak with her but he did state he didn't think she should go through surgery and was going to speak with Dr. Sedalia Muta. Since pt has not heard anything she is worried and upset it has been so long. Pt would like to know what Dr. Sedalia Muta thinks. Pt also stated after visit she began looking up arthritis and has questions about it.   Pt is upset and asking for answers today.   [name], [place] 03/31/20 12:15 PM

## 2020-03-31 NOTE — Telephone Encounter (Signed)
Returned call but Mrs. Rhonda Rivera did not feel like talking.  I tried to speak with her husband but he was outside.  I tried to review the recommendations of treatment but she said she could not remember.  She is going to call us back tomorrow when her husband is in the house.

## 2020-04-07 ENCOUNTER — Telehealth: Payer: Self-pay

## 2020-04-07 NOTE — Telephone Encounter (Signed)
Husband left Vm requesting call back as pt is stating she is wanting to die and is not eating or drinking.  Returned call to husband immediately. States wife thinks she is dying, she cant swallow, has urinated and thought she needed to pass BM but could not, is also having hot/cold flashes She can swallow liquids, but says she cant swallow food. Thinks medication is causing it she has been taking it 3 nights. Pt will not let husband call 911. Has MRI tomorrow for shoulder/ neck.   Spoke w/ Dr who advised pt's change in behavior w/ Dementia needs 911.   Called husband. Advised husband to call 911 due to change in behavior. Also stated CMA could call if needed, could also request she go to [facility] as her cardiologist is in [place]. Husband then asked pt if she wanted to go and pt stated no twice. Husband refused to let 911 be called or he call himself. He stated he would give it until the morning and call. Dr made aware.   [name], CCMA 04/07/20 11:35 AM

## 2020-04-07 NOTE — Progress Notes (Signed)
° ° °  Chronic Care Management Pharmacy Assistant   Name: MARCENA DIAS  MRN: [id number] DOB: 1940-11-24   Reason for Encounter: Initial questions for Lucia Gaskins, CPP    Recent office visits:  03/03/20-PCP- stopped grape seed and magnesium  Recent consult visits:  none  Hospital visits:  None in previous 6 months  Medications: Outpatient Encounter Medications as of 04/07/2020  Medication Sig   Ascorbic Acid (VITAMIN C) 1000 MG tablet Take 1,000 mg by mouth daily.   aspirin 325 MG EC tablet Take 650 mg by mouth in the morning and at bedtime.   atorvastatin (LIPITOR) 20 MG tablet Take 1 tablet (20 mg total) by mouth daily.   Cholecalciferol (VITAMIN D) 2000 units CAPS Take 1 capsule by mouth daily.   Coenzyme Q10 (CO Q 10) 100 MG CAPS Take 1 capsule by mouth daily.   potassium chloride (MICRO-K) 10 MEQ CR capsule Take 1 capsule (10 mEq total) by mouth daily.   Facility-Administered Encounter Medications as of 04/07/2020  Medication   triamcinolone acetonide (KENALOG-40) injection 40 mg    In the process of speaking with the patients husband, he stated she had a bad night.  He said he gave her her medication, she woke up at 1am saying her throat was swollen and she wanted to die.  He finally got her calmed down and she went back to sleep at 9am.  She got on the phone with me stating she is just laying in bed, her throat is dry, feels swollen, her stomach bubbles when she tries to drink.  She handed the phone back to her husband.  He stated she wont eat anything, and I advised him to call the office.  I told him if her throat started swelling again to call 911 and get her to the ED ASAP.  He was going to call the office to get her seen and wants to cancel her appt for tomorrow with Lucia Gaskins, CPP  I have notified Lucia Gaskins, CPP of the conversation.   Leilani Able, CMA Clinical Pharmacist Assistant [phone]

## 2020-04-08 ENCOUNTER — Telehealth: Payer: Medicare PPO

## 2020-04-08 DIAGNOSIS — M542 Cervicalgia: Secondary | ICD-10-CM | POA: Diagnosis not present

## 2020-04-20 ENCOUNTER — Ambulatory Visit (INDEPENDENT_AMBULATORY_CARE_PROVIDER_SITE_OTHER): Payer: Medicare PPO | Admitting: Family Medicine

## 2020-04-20 ENCOUNTER — Telehealth: Payer: Self-pay

## 2020-04-20 ENCOUNTER — Other Ambulatory Visit: Payer: Self-pay

## 2020-04-20 ENCOUNTER — Encounter: Payer: Self-pay | Admitting: Family Medicine

## 2020-04-20 VITALS — BP 120/72 | HR 120 | Temp 97.1°F | Ht 63.0 in | Wt 103.0 lb

## 2020-04-20 DIAGNOSIS — R778 Other specified abnormalities of plasma proteins: Secondary | ICD-10-CM | POA: Diagnosis not present

## 2020-04-20 DIAGNOSIS — E785 Hyperlipidemia, unspecified: Secondary | ICD-10-CM | POA: Diagnosis not present

## 2020-04-20 DIAGNOSIS — M545 Low back pain, unspecified: Secondary | ICD-10-CM | POA: Diagnosis not present

## 2020-04-20 DIAGNOSIS — I5020 Unspecified systolic (congestive) heart failure: Secondary | ICD-10-CM | POA: Diagnosis not present

## 2020-04-20 DIAGNOSIS — E782 Mixed hyperlipidemia: Secondary | ICD-10-CM | POA: Diagnosis not present

## 2020-04-20 DIAGNOSIS — I361 Nonrheumatic tricuspid (valve) insufficiency: Secondary | ICD-10-CM | POA: Diagnosis not present

## 2020-04-20 DIAGNOSIS — I110 Hypertensive heart disease with heart failure: Secondary | ICD-10-CM | POA: Diagnosis not present

## 2020-04-20 DIAGNOSIS — K7200 Acute and subacute hepatic failure without coma: Secondary | ICD-10-CM | POA: Diagnosis not present

## 2020-04-20 DIAGNOSIS — I509 Heart failure, unspecified: Secondary | ICD-10-CM | POA: Diagnosis not present

## 2020-04-20 DIAGNOSIS — I248 Other forms of acute ischemic heart disease: Secondary | ICD-10-CM | POA: Diagnosis not present

## 2020-04-20 DIAGNOSIS — I340 Nonrheumatic mitral (valve) insufficiency: Secondary | ICD-10-CM | POA: Diagnosis not present

## 2020-04-20 DIAGNOSIS — I5023 Acute on chronic systolic (congestive) heart failure: Secondary | ICD-10-CM | POA: Diagnosis not present

## 2020-04-20 DIAGNOSIS — G8929 Other chronic pain: Secondary | ICD-10-CM | POA: Diagnosis not present

## 2020-04-20 DIAGNOSIS — I4891 Unspecified atrial fibrillation: Secondary | ICD-10-CM

## 2020-04-20 DIAGNOSIS — R7989 Other specified abnormal findings of blood chemistry: Secondary | ICD-10-CM | POA: Diagnosis not present

## 2020-04-20 DIAGNOSIS — M542 Cervicalgia: Secondary | ICD-10-CM | POA: Diagnosis not present

## 2020-04-20 DIAGNOSIS — E43 Unspecified severe protein-calorie malnutrition: Secondary | ICD-10-CM | POA: Diagnosis not present

## 2020-04-20 DIAGNOSIS — I350 Nonrheumatic aortic (valve) stenosis: Secondary | ICD-10-CM | POA: Diagnosis not present

## 2020-04-20 DIAGNOSIS — S2232XA Fracture of one rib, left side, initial encounter for closed fracture: Secondary | ICD-10-CM | POA: Diagnosis not present

## 2020-04-20 DIAGNOSIS — J90 Pleural effusion, not elsewhere classified: Secondary | ICD-10-CM | POA: Diagnosis not present

## 2020-04-20 DIAGNOSIS — I517 Cardiomegaly: Secondary | ICD-10-CM | POA: Diagnosis not present

## 2020-04-20 DIAGNOSIS — R0789 Other chest pain: Secondary | ICD-10-CM

## 2020-04-20 DIAGNOSIS — I959 Hypotension, unspecified: Secondary | ICD-10-CM | POA: Diagnosis not present

## 2020-04-20 NOTE — Telephone Encounter (Signed)
Returned call to husband. Husband and pt to arrive at office at 1:30 for 1:45 appointment.   [name], [place] 04/20/20 9:24 AM

## 2020-04-20 NOTE — Telephone Encounter (Signed)
Husband calling states pt cannot breath. Pt states she can hardly breath. She said states she is hurting, cannot have BM, lower back pain near waist. Husband does not have way to check oxygen sat but pt speaking in background. Pt answering questions and stating she is hurting and wanting help. When she gets up to walk stomach makes noises.   Pt denies burning w/ urination, chest pain,  Complains of difficulty breathing, lower back pain, unable to void BM, stomach pain. Pt took oxycodone last night for pain.   Please advise.  [name], CCMA 04/20/20 9:10 AM

## 2020-04-20 NOTE — Progress Notes (Addendum)
Acute Office Visit  Subjective:    Patient ID: Rhonda Rivera, female    DOB: 1940/02/10, 80 y.o.   MRN: [id number]  Chief Complaint  Patient presents with  . Back Pain    HPI  Patient is a 80 yo female with hyperlipidemia, osteoporosis, severe scoliosis/kyphosis, and dementia of unknown etiology, who presents today for follow up of back pain. Pt states it starts at her upper back/neck and wraps around her abdomen/cheat. She is convinced it is the hydrocodone causing her to have chest pain, abdominal pain and difficulty breathing. It is occurring every 45-60 minutes and she has tried tums without improvement.  Chest pain: feels like acid reflux. Patient's stomach rumbles all the time. Pt is sitting leaning over knees and holds abdomen because it hurts. Pt is not taking lipitor because she did not know what it was for.   Patient frequently refuses medicines after reading side effects. She prefers to take home remedies regardless of what I say.   Poor appetite. Nothing tastes good. Dry mouth. Denies dry eyes.   Past Medical History:  Diagnosis Date  . Cardiac murmur 07/13/2017  . GERD (gastroesophageal reflux disease) 07/13/2017  . GERD (gastroesophageal reflux disease) 07/13/2017  . Hyperlipidemia 07/13/2017  . Osteoporosis 07/13/2017  . Vitamin D deficiency 07/13/2017  . Wedge compression fracture of lumbar vertebra (HCC) 04/16/2019    Past Surgical History:  Procedure Laterality Date  . ABDOMINAL HYSTERECTOMY    . LEG SURGERY      Family History  Problem Relation Age of Onset  . Heart attack Father     Social History   Socioeconomic History  . Marital status: Married    Spouse name: Not on file  . Number of children: 2  . Years of education: Not on file  . Highest education level: Not on file  Occupational History  . Occupation: Retired  Tobacco Use  . Smoking status: Never Smoker  . Smokeless tobacco: Never Used  Vaping Use  . Vaping Use: Never used  Substance  and Sexual Activity  . Alcohol use: Never  . Drug use: Never  . Sexual activity: Yes    Partners: Male  Other Topics Concern  . Not on file  Social History Narrative  . Not on file   Social Determinants of Health   Financial Resource Strain: Not on file  Food Insecurity: Not on file  Transportation Needs: Not on file  Physical Activity: Not on file  Stress: Not on file  Social Connections: Not on file  Intimate Partner Violence: Not on file    Outpatient Medications Prior to Visit  Medication Sig Dispense Refill  . HYDROcodone-acetaminophen (NORCO/VICODIN) 5-325 MG tablet Take 1 tablet by mouth every 6 (six) hours as needed for moderate pain.    Marland Kitchen aspirin 325 MG EC tablet Take 650 mg by mouth in the morning and at bedtime.    . Ascorbic Acid (VITAMIN C) 1000 MG tablet Take 1,000 mg by mouth daily.    Marland Kitchen atorvastatin (LIPITOR) 20 MG tablet Take 1 tablet (20 mg total) by mouth daily. 30 tablet 0  . Cholecalciferol (VITAMIN D) 2000 units CAPS Take 1 capsule by mouth daily.    . Coenzyme Q10 (CO Q 10) 100 MG CAPS Take 1 capsule by mouth daily.    . potassium chloride (MICRO-K) 10 MEQ CR capsule Take 1 capsule (10 mEq total) by mouth daily. 90 capsule 0   Facility-Administered Medications Prior to Visit  Medication Dose Route  Frequency Provider Last Rate Last Admin  . triamcinolone acetonide (KENALOG-40) injection 40 mg  40 mg Other Once Blane Ohara, MD        Allergies  Allergen Reactions  . Celebrex [Celecoxib] Other (See Comments)    "nervousness"  . Penicillins Swelling    Swelling and itching    Review of Systems  Constitutional: Negative for chills, fatigue and fever.  HENT: Negative for congestion, ear pain, postnasal drip, rhinorrhea, sinus pressure, sinus pain and sore throat.   Respiratory: Positive for shortness of breath. Negative for cough.   Cardiovascular: Positive for chest pain.  Gastrointestinal: Negative for diarrhea and nausea.  Musculoskeletal:  Positive for arthralgias (rt shoulder. ), back pain, myalgias and neck pain.       Objective:    Physical Exam Vitals reviewed.  Cardiovascular:     Rate and Rhythm: Tachycardia present. Rhythm irregular.     Heart sounds: Murmur heard.    Pulmonary:     Effort: No respiratory distress.     Breath sounds: Normal breath sounds.  Abdominal:     General: Bowel sounds are normal.     Palpations: Abdomen is soft.     Tenderness: There is no abdominal tenderness.  Musculoskeletal:        General: Tenderness (anterior shoulder. Tender over thoracic region. Nontender over any vertebrae. ) present.     Comments: Rt shoulder: limited abduction, external rotation, internal rotation. Discomfort produced.   Neurological:     Mental Status: She is alert.     BP 120/72   Pulse (!) 120   Temp (!) 97.1 F (36.2 C)   Ht 5\' 3"  (1.6 m)   Wt 103 lb (46.7 kg)   SpO2 96%   BMI 18.25 kg/m  Wt Readings from Last 3 Encounters:  04/20/20 103 lb (46.7 kg)  03/03/20 100 lb 9.6 oz (45.6 kg)  12/18/19 109 lb (49.4 kg)    Health Maintenance Due  Topic Date Due  . Hepatitis C Screening  Never done  . INFLUENZA VACCINE  Never done    There are no preventive care reminders to display for this patient.   Lab Results  Component Value Date   TSH 2.600 08/26/2019   Lab Results  Component Value Date   WBC 5.7 03/03/2020   HGB 14.8 03/03/2020   HCT 44.4 03/03/2020   MCV 90 03/03/2020   PLT 307 03/03/2020   Lab Results  Component Value Date   NA 141 03/03/2020   K 4.6 03/03/2020   CO2 22 03/03/2020   GLUCOSE 99 03/03/2020   BUN 9 03/03/2020   CREATININE 0.71 03/03/2020   BILITOT 0.4 03/03/2020   ALKPHOS 66 03/03/2020   AST 8 03/03/2020   ALT 7 03/03/2020   PROT 6.7 03/03/2020   ALBUMIN 4.4 03/03/2020   CALCIUM 9.7 03/03/2020   Lab Results  Component Value Date   CHOL 234 (H) 03/03/2020   Lab Results  Component Value Date   HDL 74 03/03/2020   Lab Results  Component  Value Date   LDLCALC 148 (H) 03/03/2020   Lab Results  Component Value Date   TRIG 69 03/03/2020   Lab Results  Component Value Date   CHOLHDL 3.2 03/03/2020   No results found for: HGBA1C     Assessment & Plan:  1. Atypical chest pain Pt contributes all chest pain to either GERD or her neck/back.  - EKG 12-Lead: atrial fibrillation with RVR 120s. New dx. Inverted T waves in  precordials. Rule out AMI.  Refer to ED. Refuses ambulance and husband says he will take her directly to the ED.   2. Atrial fibrillation with RVR (HCC) Newly diagnosed today, but unclear how long it has been going on.   3. Mixed hyperlipidemia Non compliance with lipitor.   4. Neck pain Mild -moderate foraminal stenosis C5-C6 Seeing Dr. Lovell Sheehan in [place].   5. Lumbar back pain  Seeing Dr. Lovell Sheehan.   6. Dementia MRI 01/2020: mild cerebral atrophy. Remote temporal insult with severe hippocampal atrophy.  Pt is in denial of dementia. Has refused medicines.    Orders Placed This Encounter  Procedures  . EKG 12-Lead     Follow-up: after ED/Hospitalization.   An After Visit Summary was printed and given to the patient.  Blane Ohara, MD [facility] [phone]

## 2020-04-21 DIAGNOSIS — I340 Nonrheumatic mitral (valve) insufficiency: Secondary | ICD-10-CM | POA: Diagnosis not present

## 2020-04-21 DIAGNOSIS — E785 Hyperlipidemia, unspecified: Secondary | ICD-10-CM

## 2020-04-21 DIAGNOSIS — I350 Nonrheumatic aortic (valve) stenosis: Secondary | ICD-10-CM

## 2020-04-21 DIAGNOSIS — I361 Nonrheumatic tricuspid (valve) insufficiency: Secondary | ICD-10-CM

## 2020-04-21 DIAGNOSIS — R778 Other specified abnormalities of plasma proteins: Secondary | ICD-10-CM | POA: Diagnosis not present

## 2020-04-21 DIAGNOSIS — I4891 Unspecified atrial fibrillation: Secondary | ICD-10-CM

## 2020-04-21 DIAGNOSIS — I509 Heart failure, unspecified: Secondary | ICD-10-CM | POA: Diagnosis not present

## 2020-04-22 DIAGNOSIS — I350 Nonrheumatic aortic (valve) stenosis: Secondary | ICD-10-CM | POA: Diagnosis not present

## 2020-04-22 DIAGNOSIS — I509 Heart failure, unspecified: Secondary | ICD-10-CM | POA: Diagnosis not present

## 2020-04-22 DIAGNOSIS — R778 Other specified abnormalities of plasma proteins: Secondary | ICD-10-CM | POA: Diagnosis not present

## 2020-04-22 DIAGNOSIS — I4891 Unspecified atrial fibrillation: Secondary | ICD-10-CM | POA: Diagnosis not present

## 2020-04-23 DIAGNOSIS — I350 Nonrheumatic aortic (valve) stenosis: Secondary | ICD-10-CM | POA: Diagnosis not present

## 2020-04-23 DIAGNOSIS — I4891 Unspecified atrial fibrillation: Secondary | ICD-10-CM | POA: Diagnosis not present

## 2020-04-23 DIAGNOSIS — I509 Heart failure, unspecified: Secondary | ICD-10-CM | POA: Diagnosis not present

## 2020-04-23 DIAGNOSIS — R778 Other specified abnormalities of plasma proteins: Secondary | ICD-10-CM | POA: Diagnosis not present

## 2020-04-24 DIAGNOSIS — R778 Other specified abnormalities of plasma proteins: Secondary | ICD-10-CM | POA: Diagnosis not present

## 2020-04-24 DIAGNOSIS — I350 Nonrheumatic aortic (valve) stenosis: Secondary | ICD-10-CM | POA: Diagnosis not present

## 2020-04-24 DIAGNOSIS — I4891 Unspecified atrial fibrillation: Secondary | ICD-10-CM | POA: Diagnosis not present

## 2020-04-24 DIAGNOSIS — I509 Heart failure, unspecified: Secondary | ICD-10-CM | POA: Diagnosis not present

## 2020-04-25 DIAGNOSIS — I350 Nonrheumatic aortic (valve) stenosis: Secondary | ICD-10-CM

## 2020-04-25 DIAGNOSIS — I959 Hypotension, unspecified: Secondary | ICD-10-CM | POA: Diagnosis not present

## 2020-04-25 DIAGNOSIS — I5020 Unspecified systolic (congestive) heart failure: Secondary | ICD-10-CM

## 2020-04-25 DIAGNOSIS — I4891 Unspecified atrial fibrillation: Secondary | ICD-10-CM

## 2020-04-26 DIAGNOSIS — I350 Nonrheumatic aortic (valve) stenosis: Secondary | ICD-10-CM | POA: Diagnosis not present

## 2020-04-26 DIAGNOSIS — I959 Hypotension, unspecified: Secondary | ICD-10-CM | POA: Diagnosis not present

## 2020-04-26 DIAGNOSIS — I5020 Unspecified systolic (congestive) heart failure: Secondary | ICD-10-CM | POA: Diagnosis not present

## 2020-04-26 DIAGNOSIS — I4891 Unspecified atrial fibrillation: Secondary | ICD-10-CM | POA: Diagnosis not present

## 2020-04-27 ENCOUNTER — Telehealth: Payer: Self-pay | Admitting: Cardiology

## 2020-04-27 DIAGNOSIS — I350 Nonrheumatic aortic (valve) stenosis: Secondary | ICD-10-CM | POA: Diagnosis not present

## 2020-04-27 DIAGNOSIS — M542 Cervicalgia: Secondary | ICD-10-CM | POA: Diagnosis not present

## 2020-04-27 DIAGNOSIS — I4891 Unspecified atrial fibrillation: Secondary | ICD-10-CM | POA: Diagnosis not present

## 2020-04-27 DIAGNOSIS — G8929 Other chronic pain: Secondary | ICD-10-CM | POA: Diagnosis not present

## 2020-04-27 DIAGNOSIS — E46 Unspecified protein-calorie malnutrition: Secondary | ICD-10-CM | POA: Diagnosis not present

## 2020-04-27 DIAGNOSIS — G309 Alzheimer's disease, unspecified: Secondary | ICD-10-CM | POA: Diagnosis not present

## 2020-04-27 DIAGNOSIS — I959 Hypotension, unspecified: Secondary | ICD-10-CM | POA: Diagnosis not present

## 2020-04-27 DIAGNOSIS — E785 Hyperlipidemia, unspecified: Secondary | ICD-10-CM | POA: Diagnosis not present

## 2020-04-27 DIAGNOSIS — M549 Dorsalgia, unspecified: Secondary | ICD-10-CM | POA: Diagnosis not present

## 2020-04-27 NOTE — Telephone Encounter (Signed)
Spoke to the patient just now and got them scheduled to see Dr. Servando Salina tomorrow as requested.

## 2020-04-27 NOTE — Telephone Encounter (Signed)
Fayrene Fearing is calling stating he was advised to call by the hospital to schedule an appt for [facility] with Dr. Servando Salina this week. An appt has not been scheduled due to not having record of this information and pt being established with Dr. Dulce Sellar. Please advise.

## 2020-04-28 ENCOUNTER — Other Ambulatory Visit: Payer: Self-pay

## 2020-04-28 ENCOUNTER — Ambulatory Visit (INDEPENDENT_AMBULATORY_CARE_PROVIDER_SITE_OTHER): Payer: Medicare PPO | Admitting: Cardiology

## 2020-04-28 ENCOUNTER — Encounter: Payer: Self-pay | Admitting: Cardiology

## 2020-04-28 VITALS — BP 120/66 | HR 74 | Ht 62.0 in | Wt 92.0 lb

## 2020-04-28 DIAGNOSIS — I480 Paroxysmal atrial fibrillation: Secondary | ICD-10-CM | POA: Insufficient documentation

## 2020-04-28 DIAGNOSIS — I959 Hypotension, unspecified: Secondary | ICD-10-CM | POA: Insufficient documentation

## 2020-04-28 DIAGNOSIS — I350 Nonrheumatic aortic (valve) stenosis: Secondary | ICD-10-CM

## 2020-04-28 DIAGNOSIS — I9589 Other hypotension: Secondary | ICD-10-CM

## 2020-04-28 DIAGNOSIS — Z79899 Other long term (current) drug therapy: Secondary | ICD-10-CM

## 2020-04-28 DIAGNOSIS — R0989 Other specified symptoms and signs involving the circulatory and respiratory systems: Secondary | ICD-10-CM | POA: Diagnosis not present

## 2020-04-28 NOTE — Progress Notes (Signed)
Cardiology Office Note:    Date:  04/28/2020   ID:  [name], DOB 06-Dec-1940, MRN [id number]  PCP:  Blane Ohara, MD  Cardiologist:  No primary care provider on file.  Electrophysiologist:  None   Referring MD: Blane Ohara, MD   I feel terrible  History of Present Illness:    Rhonda Rivera is a 80 y.o. female with a hx of paroxysmal atrial fibrillation amiodarone 200 mg twice a day with digoxin 0.0625 mg every 72 hours, anticoagulated with apixaban 2.5 mg twice a day, recently diagnosed depressed ejection fraction with low flow low gradient aortic stenosis, hyperlipidemia, hypertension is here today for a follow-up visit.  [facility] fibrillation with rapid ventricular rate noted to have severe low-flow low gradient aortic stenosis and depressed ejection fraction.  During her hospitalization she converted to sinus rhythm on minimal dose of digoxin and amiodarone she is here today for follow-up visit.  Past Medical History:  Diagnosis Date  . Cardiac murmur 07/13/2017  . GERD (gastroesophageal reflux disease) 07/13/2017  . GERD (gastroesophageal reflux disease) 07/13/2017  . Hyperlipidemia 07/13/2017  . Osteoporosis 07/13/2017  . Vitamin D deficiency 07/13/2017  . Wedge compression fracture of lumbar vertebra (HCC) 04/16/2019    Past Surgical History:  Procedure Laterality Date  . ABDOMINAL HYSTERECTOMY    . LEG SURGERY      Current Medications: Current Meds  Medication Sig  . amiodarone (PACERONE) 200 MG tablet Take 200 mg by mouth 2 (two) times daily.  Marland Kitchen apixaban (ELIQUIS) 2.5 MG TABS tablet Take 2.5 mg by mouth 2 (two) times daily.  Marland Kitchen ascorbic acid (VITAMIN C) 1000 MG tablet Take 1,000 mg by mouth daily.  Marland Kitchen aspirin 325 MG EC tablet Take 650 mg by mouth in the morning and at bedtime.  Marland Kitchen atorvastatin (LIPITOR) 20 MG tablet Take 20 mg by mouth daily.  . Cholecalciferol 50 MCG (2000 UT) CAPS Take 2,000 Units  by mouth daily.  Marland Kitchen co-enzyme Q-10 50 MG capsule Take 100 mg by mouth daily.  . digoxin (LANOXIN) 0.0625 mg TABS tablet Take 0.0625 mg by mouth every 3 (three) days.  Marland Kitchen HYDROcodone-acetaminophen (NORCO/VICODIN) 5-325 MG tablet Take 1 tablet by mouth every 6 (six) hours as needed for moderate pain.  . midodrine (PROAMATINE) 2.5 MG tablet Take 2.5 mg by mouth 3 (three) times daily with meals.  . potassium chloride (MICRO-K) 10 MEQ CR capsule Take 10 mEq by mouth daily.   Current Facility-Administered Medications for the 04/28/20 encounter (Office Visit) with Thomasene Ripple, DO  Medication  . triamcinolone acetonide (KENALOG-40) injection 40 mg     Allergies:   Celebrex [celecoxib] and Penicillins   Social History   Socioeconomic History  . Marital status: Married    Spouse name: Not on file  . Number of children: 2  . Years of education: Not on file  . Highest education level: Not on file  Occupational History  . Occupation: Retired  Tobacco Use  . Smoking status: Never Smoker  . Smokeless tobacco: Never Used  Vaping Use  . Vaping Use: Never used  Substance and Sexual Activity  . Alcohol use: Never  . Drug use: Never  . Sexual activity: Yes    Partners: Male  Other Topics Concern  . Not on file  Social History Narrative  . Not on file   Social Determinants of Health   Financial Resource Strain: Not on file  Food Insecurity:  Not on file  Transportation Needs: Not on file  Physical Activity: Not on file  Stress: Not on file  Social Connections: Not on file     Family History: The patient's family history includes Heart attack in her father.  ROS:   Review of Systems  Constitution: Negative for decreased appetite, fever and weight gain.  HENT: Negative for congestion, ear discharge, hoarse voice and sore throat.   Eyes: Negative for discharge, redness, vision loss in right eye and visual halos.  Cardiovascular: Negative for chest pain, dyspnea on exertion, leg swelling,  orthopnea and palpitations.  Respiratory: Negative for cough, hemoptysis, shortness of breath and snoring.   Endocrine: Negative for heat intolerance and polyphagia.  Hematologic/Lymphatic: Negative for bleeding problem. Does not bruise/bleed easily.  Skin: Negative for flushing, nail changes, rash and suspicious lesions.  Musculoskeletal: Negative for arthritis, joint pain, muscle cramps, myalgias, neck pain and stiffness.  Gastrointestinal: Negative for abdominal pain, bowel incontinence, diarrhea and excessive appetite.  Genitourinary: Negative for decreased libido, genital sores and incomplete emptying.  Neurological: Negative for brief paralysis, focal weakness, headaches and loss of balance.  Psychiatric/Behavioral: Negative for altered mental status, depression and suicidal ideas.  Allergic/Immunologic: Negative for HIV exposure and persistent infections.    EKGs/Labs/Other Studies Reviewed:    The following studies were reviewed today:   EKG:  The ekg ordered today demonstrates   Recent Labs: 08/26/2019: TSH 2.600 03/03/2020: ALT 7; BUN 9; Creatinine, Ser 0.71; Hemoglobin 14.8; Platelets 307; Potassium 4.6; Sodium 141  Recent Lipid Panel    Component Value Date/Time   CHOL 234 (H) 03/03/2020 0836   TRIG 69 03/03/2020 0836   HDL 74 03/03/2020 0836   CHOLHDL 3.2 03/03/2020 0836   LDLCALC 148 (H) 03/03/2020 0836    Physical Exam:    VS:  BP 120/66   Pulse 74   Ht 5\' 2"  (1.575 m)   Wt 92 lb (41.7 kg)   SpO2 95%   BMI 16.83 kg/m     Wt Readings from Last 3 Encounters:  04/28/20 92 lb (41.7 kg)  04/20/20 103 lb (46.7 kg)  03/03/20 100 lb 9.6 oz (45.6 kg)     GEN: Well nourished, well developed in no acute distress HEENT: Normal NECK: No JVD; No carotid bruits LYMPHATICS: No lymphadenopathy CARDIAC: S1S2 noted,RRR, no murmurs, rubs, gallops RESPIRATORY:  Clear to auscultation without rales, wheezing or rhonchi  ABDOMEN: Soft, non-tender, non-distended, +bowel  sounds, no guarding. EXTREMITIES: No edema, No cyanosis, no clubbing MUSCULOSKELETAL:  No deformity  SKIN: Warm and dry NEUROLOGIC:  Alert and oriented x 3, non-focal PSYCHIATRIC:  Normal affect, good insight  ASSESSMENT:    1. PAF (paroxysmal atrial fibrillation) (HCC)   2. Medication management   3. Depressed left ventricular ejection fraction   4. Severe aortic stenosis   5. Other specified hypotension    PLAN:    Her EKG today she still in sinus rhythm.  We will continue patient on amiodarone digoxin.  She also still Eliquis 2.5 mg for her anticoagulation.  We will get lab work which will include BMP, CBC and digoxin level.  So fortunately the patient has been hypotensive and is on midodrine very difficult to put her on guideline directed medications for her depressed ejection fraction.  Is of a severe low-flow low gradient aortic stenosis.  She is very frail and at this time may not be able to tolerate any interventional procedure.  During our visit she continued to remind me that I was  not her cardiologist and would like to see Dr. Dulce SellarMunley and she does not believe them a cardiologist therefore I will make sure the patient be scheduled with Dr. Dulce SellarMunley in her follow-up visits.  The patient is in agreement with the above plan. The patient left the office in stable condition.  The patient will follow up in 2 months with Dr. Dulce SellarMunley.   Medication Adjustments/Labs and Tests Ordered: Current medicines are reviewed at length with the patient today.  Concerns regarding medicines are outlined above.  Orders Placed This Encounter  Procedures  . Basic metabolic panel  . Magnesium  . Digitoxin level  . EKG 12-Lead   No orders of the defined types were placed in this encounter.   Patient Instructions  Medication Instructions:  Your physician recommends that you continue on your current medications as directed. Please refer to the Current Medication list given to you today.  *If  you need a refill on your cardiac medications before your next appointment, please call your pharmacy*   Lab Work: Your physician recommends that you return for lab work: TODAY:BMET,Mag, Digoxin  If you have labs (blood work) drawn today and your tests are completely normal, you will receive your results only by: Marland Kitchen. MyChart Message (if you have MyChart) OR . A paper copy in the mail If you have any lab test that is abnormal or we need to change your treatment, we will call you to review the results.   Testing/Procedures: None   Follow-Up: At [facility], you and your health needs are our priority.  As part of our continuing mission to provide you with exceptional heart care, we have created designated Provider Care Teams.  These Care Teams include your primary Cardiologist (physician) and Advanced Practice Providers (APPs -  Physician Assistants and Nurse Practitioners) who all work together to provide you with the care you need, when you need it.  We recommend signing up for the patient portal called "MyChart".  Sign up information is provided on this After Visit Summary.  MyChart is used to connect with patients for Virtual Visits (Telemedicine).  Patients are able to view lab/test results, encounter notes, upcoming appointments, etc.  Non-urgent messages can be sent to your provider as well.   To learn more about what you can do with MyChart, go to [email or web address].    Your next appointment:   2 month(s)  The format for your next appointment:   In Person  Provider:   Norman HerrlichBrian Munley, MD   Other Instructions      Adopting a Healthy Lifestyle.  Know what a healthy weight is for you (roughly BMI <25) and aim to maintain this   Aim for 7+ servings of fruits and vegetables daily   65-80+ fluid ounces of water or unsweet tea for healthy kidneys   Limit to max 1 drink of alcohol per day; avoid smoking/tobacco   Limit animal fats in diet for cholesterol and heart  health - choose grass fed whenever available   Avoid highly processed foods, and foods high in saturated/trans fats   Aim for low stress - take time to unwind and care for your mental health   Aim for 150 min of moderate intensity exercise weekly for heart health, and weights twice weekly for bone health   Aim for 7-9 hours of sleep daily   When it comes to diets, agreement about the perfect plan isnt easy to find, even among the experts. Experts at the [employer]  developed an idea known as the Healthy Eating Plate. Just imagine a plate divided into logical, healthy portions.   The emphasis is on diet quality:   Load up on vegetables and fruits - one-half of your plate: Aim for color and variety, and remember that potatoes dont count.   Go for whole grains - one-quarter of your plate: Whole wheat, barley, wheat berries, quinoa, oats, brown rice, and foods made with them. If you want pasta, go with whole wheat pasta.   Protein power - one-quarter of your plate: Fish, chicken, beans, and nuts are all healthy, versatile protein sources. Limit red meat.   The diet, however, does go beyond the plate, offering a few other suggestions.   Use healthy plant oils, such as olive, canola, soy, corn, sunflower and peanut. Check the labels, and avoid partially hydrogenated oil, which have unhealthy trans fats.   If youre thirsty, drink water. Coffee and tea are good in moderation, but skip sugary drinks and limit milk and dairy products to one or two daily servings.   The type of carbohydrate in the diet is more important than the amount. Some sources of carbohydrates, such as vegetables, fruits, whole grains, and beans-are healthier than others.   Finally, stay active  Signed, Thomasene Ripple, DO  04/28/2020 3:00 PM     [facility]

## 2020-04-28 NOTE — Progress Notes (Signed)
Per patient's husband she is only on limited medications. Medication list updated per his request.

## 2020-04-28 NOTE — Addendum Note (Signed)
Addended by: Reynolds Bowl on: 04/28/2020 03:23 PM   Modules accepted: Orders

## 2020-04-28 NOTE — Patient Instructions (Signed)
Medication Instructions:  Your physician recommends that you continue on your current medications as directed. Please refer to the Current Medication list given to you today.  *If you need a refill on your cardiac medications before your next appointment, please call your pharmacy*   Lab Work: Your physician recommends that you return for lab work: TODAY:BMET,Mag, Digoxin  If you have labs (blood work) drawn today and your tests are completely normal, you will receive your results only by: Marland Kitchen MyChart Message (if you have MyChart) OR . A paper copy in the mail If you have any lab test that is abnormal or we need to change your treatment, we will call you to review the results.   Testing/Procedures: None   Follow-Up: At [facility], you and your health needs are our priority.  As part of our continuing mission to provide you with exceptional heart care, we have created designated Provider Care Teams.  These Care Teams include your primary Cardiologist (physician) and Advanced Practice Providers (APPs -  Physician Assistants and Nurse Practitioners) who all work together to provide you with the care you need, when you need it.  We recommend signing up for the patient portal called "MyChart".  Sign up information is provided on this After Visit Summary.  MyChart is used to connect with patients for Virtual Visits (Telemedicine).  Patients are able to view lab/test results, encounter notes, upcoming appointments, etc.  Non-urgent messages can be sent to your provider as well.   To learn more about what you can do with MyChart, go to [email or web address].    Your next appointment:   2 month(s)  The format for your next appointment:   In Person  Provider:   Norman Herrlich, MD   Other Instructions

## 2020-04-29 ENCOUNTER — Telehealth: Payer: Self-pay

## 2020-04-29 DIAGNOSIS — G309 Alzheimer's disease, unspecified: Secondary | ICD-10-CM | POA: Diagnosis not present

## 2020-04-29 DIAGNOSIS — I959 Hypotension, unspecified: Secondary | ICD-10-CM | POA: Diagnosis not present

## 2020-04-29 DIAGNOSIS — E785 Hyperlipidemia, unspecified: Secondary | ICD-10-CM | POA: Diagnosis not present

## 2020-04-29 DIAGNOSIS — E46 Unspecified protein-calorie malnutrition: Secondary | ICD-10-CM | POA: Diagnosis not present

## 2020-04-29 DIAGNOSIS — I350 Nonrheumatic aortic (valve) stenosis: Secondary | ICD-10-CM | POA: Diagnosis not present

## 2020-04-29 DIAGNOSIS — I4891 Unspecified atrial fibrillation: Secondary | ICD-10-CM | POA: Diagnosis not present

## 2020-04-29 DIAGNOSIS — G8929 Other chronic pain: Secondary | ICD-10-CM | POA: Diagnosis not present

## 2020-04-29 DIAGNOSIS — M542 Cervicalgia: Secondary | ICD-10-CM | POA: Diagnosis not present

## 2020-04-29 DIAGNOSIS — M549 Dorsalgia, unspecified: Secondary | ICD-10-CM | POA: Diagnosis not present

## 2020-04-29 NOTE — Telephone Encounter (Signed)
Spoke w/ Jasmine December. Gave verbal orders for nursing. Jasmine December asked if something can be prescribed to get patient more calm and to be able to sleep. Pt's husband told sharon he did not know how much more he could take.  Please advise.   [name], CCMA 04/29/20 11:55 AM

## 2020-04-29 NOTE — Telephone Encounter (Signed)
Give orders to [facility] for visits twice a week for 3 weeks and then once a week for 6 weeks lp

## 2020-04-29 NOTE — Telephone Encounter (Signed)
Jasmine December, nurse w/ [facility], called requesting nursing orders for twice a week for three weeks the once a week for six weeks. Have not given verbal orders yet.  Jasmine December also concerned about drug interactions. Concerned about amiodarone w/ digoxin, amiodarone w/ atorvastatin, and eliquis w/ aspirin.   According to cardiology note from yesterday, pt is to continue digoxin amiodarone treatment. Seems cardiology d/c atorvastatin. Please advise.   Nurse also states pt is not sleeping at night and has been argumentative with everyone around her. Asking if she needs anything to calm her down. Also states pt is not eating much and weighs 92 lbs.   Please advise.   [name], [place] 04/29/20 10:41 AM

## 2020-04-30 ENCOUNTER — Telehealth: Payer: Self-pay

## 2020-04-30 ENCOUNTER — Other Ambulatory Visit: Payer: Self-pay | Admitting: Physician Assistant

## 2020-04-30 ENCOUNTER — Other Ambulatory Visit: Payer: Self-pay

## 2020-04-30 DIAGNOSIS — I959 Hypotension, unspecified: Secondary | ICD-10-CM | POA: Diagnosis not present

## 2020-04-30 DIAGNOSIS — I4891 Unspecified atrial fibrillation: Secondary | ICD-10-CM | POA: Diagnosis not present

## 2020-04-30 DIAGNOSIS — M542 Cervicalgia: Secondary | ICD-10-CM | POA: Diagnosis not present

## 2020-04-30 DIAGNOSIS — I350 Nonrheumatic aortic (valve) stenosis: Secondary | ICD-10-CM | POA: Diagnosis not present

## 2020-04-30 DIAGNOSIS — G8929 Other chronic pain: Secondary | ICD-10-CM | POA: Diagnosis not present

## 2020-04-30 DIAGNOSIS — E785 Hyperlipidemia, unspecified: Secondary | ICD-10-CM | POA: Diagnosis not present

## 2020-04-30 DIAGNOSIS — M549 Dorsalgia, unspecified: Secondary | ICD-10-CM | POA: Diagnosis not present

## 2020-04-30 DIAGNOSIS — E46 Unspecified protein-calorie malnutrition: Secondary | ICD-10-CM | POA: Diagnosis not present

## 2020-04-30 DIAGNOSIS — G309 Alzheimer's disease, unspecified: Secondary | ICD-10-CM | POA: Diagnosis not present

## 2020-04-30 MED ORDER — DICLOFENAC EPOLAMINE 1.3 % EX PTCH: 1.0000 | MEDICATED_PATCH | Freq: Two times a day (BID) | CUTANEOUS | 0 refills | Status: DC

## 2020-04-30 MED ORDER — TIZANIDINE HCL 2 MG PO CAPS: 2.0000 mg | ORAL_CAPSULE | Freq: Three times a day (TID) | ORAL | 0 refills | Status: DC

## 2020-04-30 NOTE — Telephone Encounter (Signed)
Rhonda Rivera was called back about Rhonda Rivera's restless night's.  He reports that she is having problems sleeping because of her back pain that radiates into her side.  Symptoms reviewed with Dr.Cox and prescription for Tizanidine 2 mg prn sent to pharmacy.  Reviewed with patient and husband that she should not be taking any ASA or NSAIDS.

## 2020-04-30 NOTE — Telephone Encounter (Signed)
Dr. Sedalia Muta recommended the patient try lidocaine 1 % roll-on over the counter.  Flector patch twice daily will be sent to the pharmacy.  It was stressed to Rhonda Rivera that it is very important for her to come in Monday for hospital follow-up with Dr. Marina Rivera.  I reinforced that she should be very cautious of falls since she is on blood thinner.  NO aspirin or NSAIDS.  Continue Home Health and PT

## 2020-05-01 ENCOUNTER — Telehealth: Payer: Self-pay

## 2020-05-01 NOTE — Telephone Encounter (Signed)
Barbara Cower, PT w/ [facility], requested verbal orders to see pt twice a week for four weeks then once a week for four weeks. CMA gave verbal orders for this schedule.   [name], CCMA 05/01/20 11:13 AM

## 2020-05-02 LAB — BASIC METABOLIC PANEL
BUN/Creatinine Ratio: 12 (ref 12–28)
BUN: 7 mg/dL — ABNORMAL LOW (ref 8–27)
CO2: 22 mmol/L (ref 20–29)
Calcium: 9.1 mg/dL (ref 8.7–10.3)
Chloride: 102 mmol/L (ref 96–106)
Creatinine, Ser: 0.59 mg/dL (ref 0.57–1.00)
Glucose: 102 mg/dL — ABNORMAL HIGH (ref 65–99)
Potassium: 3.9 mmol/L (ref 3.5–5.2)
Sodium: 141 mmol/L (ref 134–144)
eGFR: 92 mL/min/{1.73_m2} (ref >59)

## 2020-05-02 LAB — MAGNESIUM: Magnesium: 2.3 mg/dL (ref 1.6–2.3)

## 2020-05-02 LAB — DIGITOXIN LEVEL: Digitoxin Lvl: <5 ng/mL — ABNORMAL LOW (ref 10–25)

## 2020-05-04 ENCOUNTER — Inpatient Hospital Stay: Payer: Medicare PPO | Admitting: Family Medicine

## 2020-05-04 ENCOUNTER — Ambulatory Visit: Payer: Medicare PPO | Admitting: Legal Medicine

## 2020-05-04 ENCOUNTER — Encounter: Payer: Self-pay | Admitting: Legal Medicine

## 2020-05-04 ENCOUNTER — Other Ambulatory Visit: Payer: Self-pay

## 2020-05-04 VITALS — BP 120/78 | HR 73 | Temp 97.5°F | Resp 17 | Ht 62.0 in | Wt 93.6 lb

## 2020-05-04 DIAGNOSIS — E785 Hyperlipidemia, unspecified: Secondary | ICD-10-CM | POA: Diagnosis not present

## 2020-05-04 DIAGNOSIS — F0150 Vascular dementia without behavioral disturbance: Secondary | ICD-10-CM

## 2020-05-04 DIAGNOSIS — I480 Paroxysmal atrial fibrillation: Secondary | ICD-10-CM | POA: Diagnosis not present

## 2020-05-04 DIAGNOSIS — M419 Scoliosis, unspecified: Secondary | ICD-10-CM | POA: Diagnosis not present

## 2020-05-04 DIAGNOSIS — E43 Unspecified severe protein-calorie malnutrition: Secondary | ICD-10-CM | POA: Diagnosis not present

## 2020-05-04 DIAGNOSIS — I5020 Unspecified systolic (congestive) heart failure: Secondary | ICD-10-CM | POA: Diagnosis not present

## 2020-05-04 DIAGNOSIS — I4891 Unspecified atrial fibrillation: Secondary | ICD-10-CM | POA: Diagnosis not present

## 2020-05-04 DIAGNOSIS — M8000XD Age-related osteoporosis with current pathological fracture, unspecified site, subsequent encounter for fracture with routine healing: Secondary | ICD-10-CM | POA: Diagnosis not present

## 2020-05-04 DIAGNOSIS — I959 Hypotension, unspecified: Secondary | ICD-10-CM | POA: Diagnosis not present

## 2020-05-04 DIAGNOSIS — G8929 Other chronic pain: Secondary | ICD-10-CM | POA: Diagnosis not present

## 2020-05-04 DIAGNOSIS — M549 Dorsalgia, unspecified: Secondary | ICD-10-CM | POA: Diagnosis not present

## 2020-05-04 DIAGNOSIS — E46 Unspecified protein-calorie malnutrition: Secondary | ICD-10-CM | POA: Diagnosis not present

## 2020-05-04 DIAGNOSIS — I350 Nonrheumatic aortic (valve) stenosis: Secondary | ICD-10-CM | POA: Diagnosis not present

## 2020-05-04 DIAGNOSIS — M542 Cervicalgia: Secondary | ICD-10-CM | POA: Diagnosis not present

## 2020-05-04 DIAGNOSIS — G309 Alzheimer's disease, unspecified: Secondary | ICD-10-CM | POA: Diagnosis not present

## 2020-05-04 MED ORDER — MIRTAZAPINE 15 MG PO TABS: 15.0000 mg | ORAL_TABLET | Freq: Every day | ORAL | 3 refills | Status: DC

## 2020-05-04 NOTE — Progress Notes (Signed)
Subjective:  Patient ID: Rhonda Rivera, female    DOB: 09/28/1940  Age: 80 y.o. MRN: [id number]  Chief Complaint  Patient presents with  . Hospitalization Follow-up  . Atrial Fibrillation  . Congestive Heart Failure    HPI: transition of care and reconciliation of medicines  Patient admitted to [facility] on 04/20/2020 for atrial fibrillation with rapid ventricular response, aortic stenosis and mild CHF. She was digoxin toxic and medicine reduced She was discharged on 04/26/2020  Patient has dementia but no ready to accept hospice per hospital. Heart doing well, and she saw cardiologist.   Current Outpatient Medications on File Prior to Visit  Medication Sig Dispense Refill  . amiodarone (PACERONE) 200 MG tablet Take 200 mg by mouth in the morning and at bedtime.    Marland Kitchen apixaban (ELIQUIS) 2.5 MG TABS tablet Take 2.5 mg by mouth 2 (two) times daily.    . digoxin (LANOXIN) 0.0625 mg TABS tablet Take 0.0625 mg by mouth every 3 (three) days.    . midodrine (PROAMATINE) 2.5 MG tablet Take 2.5 mg by mouth 3 (three) times daily with meals.    . tizanidine (ZANAFLEX) 2 MG capsule Take 1 capsule (2 mg total) by mouth 3 (three) times daily. 60 capsule 0   Current Facility-Administered Medications on File Prior to Visit  Medication Dose Route Frequency Provider Last Rate Last Admin  . triamcinolone acetonide (KENALOG-40) injection 40 mg  40 mg Other Once Blane Ohara, MD       Past Medical History:  Diagnosis Date  . GERD (gastroesophageal reflux disease) 07/13/2017  . Hyperlipidemia 07/13/2017  . Osteoporosis 07/13/2017  . Vitamin D deficiency 07/13/2017   Past Surgical History:  Procedure Laterality Date  . ABDOMINAL HYSTERECTOMY    . LEG SURGERY      Family History  Problem Relation Age of Onset  . Heart attack Father    Social History   Socioeconomic History  . Marital status: Married    Spouse name: Not on file  . Number of children: 2  . Years of education: Not on  file  . Highest education level: Not on file  Occupational History  . Occupation: Retired  Tobacco Use  . Smoking status: Never Smoker  . Smokeless tobacco: Never Used  Vaping Use  . Vaping Use: Never used  Substance and Sexual Activity  . Alcohol use: Never  . Drug use: Never  . Sexual activity: Yes    Partners: Male  Other Topics Concern  . Not on file  Social History Narrative  . Not on file   Social Determinants of Health   Financial Resource Strain: Not on file  Food Insecurity: Not on file  Transportation Needs: Not on file  Physical Activity: Not on file  Stress: Not on file  Social Connections: Not on file    Review of Systems  Constitutional: Negative for activity change and appetite change.  HENT: Negative for congestion.   Eyes: Negative for visual disturbance.  Respiratory: Negative for chest tightness and shortness of breath.   Cardiovascular: Negative for chest pain, palpitations and leg swelling.  Gastrointestinal: Negative.  Negative for abdominal distention and abdominal pain.  Endocrine: Negative for polyuria.  Genitourinary: Negative for difficulty urinating and dyspareunia.  Musculoskeletal: Positive for arthralgias and back pain.  Skin: Negative.   Neurological: Negative.   Psychiatric/Behavioral: Negative.      Objective:  BP 120/78   Pulse 73   Temp (!) 97.5 F (36.4 C)  Resp 17   Ht 5\' 2"  (1.575 m)   Wt 93 lb 9.6 oz (42.5 kg)   SpO2 97%   BMI 17.12 kg/m   BP/Weight 05/04/2020 04/28/2020 04/20/2020  Systolic BP 120 120 120  Diastolic BP 78 66 72  Wt. (Lbs) 93.6 92 103  BMI 17.12 16.83 18.25    Physical Exam Vitals reviewed.  Constitutional:      Appearance: Normal appearance.  HENT:     Head: Normocephalic and atraumatic.     Right Ear: Tympanic membrane normal.     Left Ear: Tympanic membrane normal.     Nose: Nose normal.     Mouth/Throat:     Mouth: Mucous membranes are moist.  Eyes:     Extraocular Movements:  Extraocular movements intact.     Conjunctiva/sclera: Conjunctivae normal.     Pupils: Pupils are equal, round, and reactive to light.  Cardiovascular:     Rate and Rhythm: Normal rate.     Pulses: Normal pulses.     Heart sounds: Murmur (systolic AS murmur ) heard.    Pulmonary:     Effort: Pulmonary effort is normal. No respiratory distress.     Breath sounds: Normal breath sounds. No rales.  Abdominal:     General: Abdomen is flat. Bowel sounds are normal. There is no distension.     Palpations: Abdomen is soft.     Tenderness: There is no abdominal tenderness.  Musculoskeletal:        General: Tenderness and deformity (back) present.     Cervical back: Normal range of motion and neck supple.     Right lower leg: No edema.     Left lower leg: No edema.     Comments: kyphscoliosis  Skin:    General: Skin is warm and dry.     Capillary Refill: Capillary refill takes less than 2 seconds.  Neurological:     General: No focal deficit present.     Mental Status: She is alert and oriented to person, place, and time.       Lab Results  Component Value Date   WBC 5.7 03/03/2020   HGB 14.8 03/03/2020   HCT 44.4 03/03/2020   PLT 307 03/03/2020   GLUCOSE 102 (H) 04/28/2020   CHOL 234 (H) 03/03/2020   TRIG 69 03/03/2020   HDL 74 03/03/2020   LDLCALC 148 (H) 03/03/2020   ALT 7 03/03/2020   AST 8 03/03/2020   NA 141 04/28/2020   K 3.9 04/28/2020   CL 102 04/28/2020   CREATININE 0.59 04/28/2020   BUN 7 (L) 04/28/2020   CO2 22 04/28/2020   TSH 2.600 08/26/2019   INR 0.9 03/02/2007      Assessment & Plan:   Diagnoses and all orders for this visit: PAF (paroxysmal atrial fibrillation) (HCC)stabilized on digoxin 0.0625 q72 hours and amiodarone  HFrEF (heart failure with reduced ejection fraction) (HCC) An individualized care plan was established and reinforced.  The patient's disease status was assessed using clinical finding son exam today, labs, and/or other  diagnostic testing such as x-rays, to determine the patient's success in meeting treatmentgoalsbased on disease-based guidelines and found to beimproving. But not at goal yet. Medications prescriptions digoxin changed Laboratory tests ordered to be performed today include none. RECOMMENDATIONS: given include see cardiology.  Call physician is patient gains 3 lbs in one day or 5 lbs for one week.  Call for progressive PND, orthopnea or increased pedal edema.  Severe aortic stenosis Patient has  severe aortic stenosis but hemodynamically stable  Kyphoscoliosis Patient has severe osteoporosis and scoliosis with history of spinal compression fractures  Age-related osteoporosis with current pathological fracture with routine healing, subsequent encounter AN INDIVIDUAL CARE PLAN for osteoporosis was established and reinforced today.  The patient's status was assessed using clinical findings on exam, labs, and other diagnostic testing. Patient's success at meeting treatment goals based on disease specific evidence-bassed guidelines and found to be in poor control. RECOMMENDATIONS include try to get prolia covered present medicines and treatment.  Severe protein-calorie malnutrition (HCC) Supplement nutrition with protein/calorie supplement with meals to improve nutritional status.         Follow-up: Return in about 2 months (around 07/04/2020) for chronic.  An After Visit Summary was printed and given to the patient.  Brent Bulla, MD [facility] [phone]

## 2020-05-05 ENCOUNTER — Telehealth: Payer: Self-pay

## 2020-05-05 DIAGNOSIS — I350 Nonrheumatic aortic (valve) stenosis: Secondary | ICD-10-CM | POA: Diagnosis not present

## 2020-05-05 DIAGNOSIS — M542 Cervicalgia: Secondary | ICD-10-CM | POA: Diagnosis not present

## 2020-05-05 DIAGNOSIS — E785 Hyperlipidemia, unspecified: Secondary | ICD-10-CM | POA: Diagnosis not present

## 2020-05-05 DIAGNOSIS — G309 Alzheimer's disease, unspecified: Secondary | ICD-10-CM | POA: Diagnosis not present

## 2020-05-05 DIAGNOSIS — I959 Hypotension, unspecified: Secondary | ICD-10-CM | POA: Diagnosis not present

## 2020-05-05 DIAGNOSIS — M549 Dorsalgia, unspecified: Secondary | ICD-10-CM | POA: Diagnosis not present

## 2020-05-05 DIAGNOSIS — G8929 Other chronic pain: Secondary | ICD-10-CM | POA: Diagnosis not present

## 2020-05-05 DIAGNOSIS — I4891 Unspecified atrial fibrillation: Secondary | ICD-10-CM | POA: Diagnosis not present

## 2020-05-05 DIAGNOSIS — E46 Unspecified protein-calorie malnutrition: Secondary | ICD-10-CM | POA: Diagnosis not present

## 2020-05-05 NOTE — Telephone Encounter (Signed)
PA submitted and denied for Diclofenac Epolamine 1.3% patches via covermymeds. Waiting for insurance explanation of denial to be faxed over.

## 2020-05-06 ENCOUNTER — Telehealth: Payer: Self-pay

## 2020-05-06 NOTE — Telephone Encounter (Signed)
Barbara Cower, PT w/ home health, left VM requesting pt be seen by OT home health due to bathroom situations.   Called [place] back. Gave verbal for OT to evaluate pt.  [name], [place] 05/06/20 9:58 AM

## 2020-05-07 ENCOUNTER — Telehealth: Payer: Self-pay | Admitting: Cardiology

## 2020-05-07 DIAGNOSIS — I4891 Unspecified atrial fibrillation: Secondary | ICD-10-CM | POA: Diagnosis not present

## 2020-05-07 DIAGNOSIS — G309 Alzheimer's disease, unspecified: Secondary | ICD-10-CM | POA: Diagnosis not present

## 2020-05-07 DIAGNOSIS — I350 Nonrheumatic aortic (valve) stenosis: Secondary | ICD-10-CM | POA: Diagnosis not present

## 2020-05-07 DIAGNOSIS — E785 Hyperlipidemia, unspecified: Secondary | ICD-10-CM | POA: Diagnosis not present

## 2020-05-07 DIAGNOSIS — E46 Unspecified protein-calorie malnutrition: Secondary | ICD-10-CM | POA: Diagnosis not present

## 2020-05-07 DIAGNOSIS — G8929 Other chronic pain: Secondary | ICD-10-CM | POA: Diagnosis not present

## 2020-05-07 DIAGNOSIS — M542 Cervicalgia: Secondary | ICD-10-CM | POA: Diagnosis not present

## 2020-05-07 DIAGNOSIS — M549 Dorsalgia, unspecified: Secondary | ICD-10-CM | POA: Diagnosis not present

## 2020-05-07 DIAGNOSIS — I959 Hypotension, unspecified: Secondary | ICD-10-CM | POA: Diagnosis not present

## 2020-05-07 NOTE — Telephone Encounter (Signed)
? ?  Pt returning call to get lab result ?

## 2020-05-08 ENCOUNTER — Telehealth: Payer: Self-pay | Admitting: Legal Medicine

## 2020-05-10 NOTE — Telephone Encounter (Signed)
Patient called with dry mouth, she is drinking water, I suggested glycerine swaBS, GARGLE. Swish moisturizing solution available at pharmacy.  She was not happy lp

## 2020-05-11 DIAGNOSIS — I350 Nonrheumatic aortic (valve) stenosis: Secondary | ICD-10-CM | POA: Diagnosis not present

## 2020-05-11 DIAGNOSIS — E785 Hyperlipidemia, unspecified: Secondary | ICD-10-CM | POA: Diagnosis not present

## 2020-05-11 DIAGNOSIS — G8929 Other chronic pain: Secondary | ICD-10-CM | POA: Diagnosis not present

## 2020-05-11 DIAGNOSIS — I4891 Unspecified atrial fibrillation: Secondary | ICD-10-CM | POA: Diagnosis not present

## 2020-05-11 DIAGNOSIS — I959 Hypotension, unspecified: Secondary | ICD-10-CM | POA: Diagnosis not present

## 2020-05-11 DIAGNOSIS — G309 Alzheimer's disease, unspecified: Secondary | ICD-10-CM | POA: Diagnosis not present

## 2020-05-11 DIAGNOSIS — E46 Unspecified protein-calorie malnutrition: Secondary | ICD-10-CM | POA: Diagnosis not present

## 2020-05-11 DIAGNOSIS — M549 Dorsalgia, unspecified: Secondary | ICD-10-CM | POA: Diagnosis not present

## 2020-05-11 DIAGNOSIS — M542 Cervicalgia: Secondary | ICD-10-CM | POA: Diagnosis not present

## 2020-05-12 DIAGNOSIS — I4891 Unspecified atrial fibrillation: Secondary | ICD-10-CM | POA: Diagnosis not present

## 2020-05-12 DIAGNOSIS — I959 Hypotension, unspecified: Secondary | ICD-10-CM | POA: Diagnosis not present

## 2020-05-12 DIAGNOSIS — G8929 Other chronic pain: Secondary | ICD-10-CM | POA: Diagnosis not present

## 2020-05-12 DIAGNOSIS — I350 Nonrheumatic aortic (valve) stenosis: Secondary | ICD-10-CM | POA: Diagnosis not present

## 2020-05-12 DIAGNOSIS — G309 Alzheimer's disease, unspecified: Secondary | ICD-10-CM | POA: Diagnosis not present

## 2020-05-12 DIAGNOSIS — E46 Unspecified protein-calorie malnutrition: Secondary | ICD-10-CM | POA: Diagnosis not present

## 2020-05-12 DIAGNOSIS — M549 Dorsalgia, unspecified: Secondary | ICD-10-CM | POA: Diagnosis not present

## 2020-05-12 DIAGNOSIS — E785 Hyperlipidemia, unspecified: Secondary | ICD-10-CM | POA: Diagnosis not present

## 2020-05-12 DIAGNOSIS — M542 Cervicalgia: Secondary | ICD-10-CM | POA: Diagnosis not present

## 2020-05-15 ENCOUNTER — Other Ambulatory Visit: Payer: Self-pay | Admitting: Family Medicine

## 2020-05-15 DIAGNOSIS — M549 Dorsalgia, unspecified: Secondary | ICD-10-CM | POA: Diagnosis not present

## 2020-05-15 DIAGNOSIS — E785 Hyperlipidemia, unspecified: Secondary | ICD-10-CM | POA: Diagnosis not present

## 2020-05-15 DIAGNOSIS — M542 Cervicalgia: Secondary | ICD-10-CM | POA: Diagnosis not present

## 2020-05-15 DIAGNOSIS — G8929 Other chronic pain: Secondary | ICD-10-CM | POA: Diagnosis not present

## 2020-05-15 DIAGNOSIS — E46 Unspecified protein-calorie malnutrition: Secondary | ICD-10-CM | POA: Diagnosis not present

## 2020-05-15 DIAGNOSIS — I4891 Unspecified atrial fibrillation: Secondary | ICD-10-CM | POA: Diagnosis not present

## 2020-05-15 DIAGNOSIS — I350 Nonrheumatic aortic (valve) stenosis: Secondary | ICD-10-CM | POA: Diagnosis not present

## 2020-05-15 DIAGNOSIS — G309 Alzheimer's disease, unspecified: Secondary | ICD-10-CM | POA: Diagnosis not present

## 2020-05-15 DIAGNOSIS — I959 Hypotension, unspecified: Secondary | ICD-10-CM | POA: Diagnosis not present

## 2020-05-15 MED ORDER — DICLOFENAC EPOLAMINE 1.3 % EX PTCH: 1.0000 | MEDICATED_PATCH | Freq: Two times a day (BID) | CUTANEOUS | 5 refills | Status: DC

## 2020-05-18 DIAGNOSIS — G309 Alzheimer's disease, unspecified: Secondary | ICD-10-CM | POA: Diagnosis not present

## 2020-05-18 DIAGNOSIS — M542 Cervicalgia: Secondary | ICD-10-CM | POA: Diagnosis not present

## 2020-05-18 DIAGNOSIS — I4891 Unspecified atrial fibrillation: Secondary | ICD-10-CM | POA: Diagnosis not present

## 2020-05-18 DIAGNOSIS — E46 Unspecified protein-calorie malnutrition: Secondary | ICD-10-CM | POA: Diagnosis not present

## 2020-05-18 DIAGNOSIS — E785 Hyperlipidemia, unspecified: Secondary | ICD-10-CM | POA: Diagnosis not present

## 2020-05-18 DIAGNOSIS — G8929 Other chronic pain: Secondary | ICD-10-CM | POA: Diagnosis not present

## 2020-05-18 DIAGNOSIS — I350 Nonrheumatic aortic (valve) stenosis: Secondary | ICD-10-CM | POA: Diagnosis not present

## 2020-05-18 DIAGNOSIS — I959 Hypotension, unspecified: Secondary | ICD-10-CM | POA: Diagnosis not present

## 2020-05-18 DIAGNOSIS — M549 Dorsalgia, unspecified: Secondary | ICD-10-CM | POA: Diagnosis not present

## 2020-05-19 DIAGNOSIS — I350 Nonrheumatic aortic (valve) stenosis: Secondary | ICD-10-CM | POA: Diagnosis not present

## 2020-05-19 DIAGNOSIS — E785 Hyperlipidemia, unspecified: Secondary | ICD-10-CM | POA: Diagnosis not present

## 2020-05-19 DIAGNOSIS — G309 Alzheimer's disease, unspecified: Secondary | ICD-10-CM | POA: Diagnosis not present

## 2020-05-19 DIAGNOSIS — M549 Dorsalgia, unspecified: Secondary | ICD-10-CM | POA: Diagnosis not present

## 2020-05-19 DIAGNOSIS — M542 Cervicalgia: Secondary | ICD-10-CM | POA: Diagnosis not present

## 2020-05-19 DIAGNOSIS — I959 Hypotension, unspecified: Secondary | ICD-10-CM | POA: Diagnosis not present

## 2020-05-19 DIAGNOSIS — E46 Unspecified protein-calorie malnutrition: Secondary | ICD-10-CM | POA: Diagnosis not present

## 2020-05-19 DIAGNOSIS — G8929 Other chronic pain: Secondary | ICD-10-CM | POA: Diagnosis not present

## 2020-05-19 DIAGNOSIS — I4891 Unspecified atrial fibrillation: Secondary | ICD-10-CM | POA: Diagnosis not present

## 2020-05-20 DIAGNOSIS — I959 Hypotension, unspecified: Secondary | ICD-10-CM | POA: Diagnosis not present

## 2020-05-20 DIAGNOSIS — E46 Unspecified protein-calorie malnutrition: Secondary | ICD-10-CM | POA: Diagnosis not present

## 2020-05-20 DIAGNOSIS — M542 Cervicalgia: Secondary | ICD-10-CM | POA: Diagnosis not present

## 2020-05-20 DIAGNOSIS — G309 Alzheimer's disease, unspecified: Secondary | ICD-10-CM | POA: Diagnosis not present

## 2020-05-20 DIAGNOSIS — G8929 Other chronic pain: Secondary | ICD-10-CM | POA: Diagnosis not present

## 2020-05-20 DIAGNOSIS — E785 Hyperlipidemia, unspecified: Secondary | ICD-10-CM | POA: Diagnosis not present

## 2020-05-20 DIAGNOSIS — I350 Nonrheumatic aortic (valve) stenosis: Secondary | ICD-10-CM | POA: Diagnosis not present

## 2020-05-20 DIAGNOSIS — M549 Dorsalgia, unspecified: Secondary | ICD-10-CM | POA: Diagnosis not present

## 2020-05-20 DIAGNOSIS — I4891 Unspecified atrial fibrillation: Secondary | ICD-10-CM | POA: Diagnosis not present

## 2020-05-21 DIAGNOSIS — M542 Cervicalgia: Secondary | ICD-10-CM | POA: Diagnosis not present

## 2020-05-21 DIAGNOSIS — G8929 Other chronic pain: Secondary | ICD-10-CM | POA: Diagnosis not present

## 2020-05-21 DIAGNOSIS — I350 Nonrheumatic aortic (valve) stenosis: Secondary | ICD-10-CM | POA: Diagnosis not present

## 2020-05-21 DIAGNOSIS — E46 Unspecified protein-calorie malnutrition: Secondary | ICD-10-CM | POA: Diagnosis not present

## 2020-05-21 DIAGNOSIS — M549 Dorsalgia, unspecified: Secondary | ICD-10-CM | POA: Diagnosis not present

## 2020-05-21 DIAGNOSIS — E785 Hyperlipidemia, unspecified: Secondary | ICD-10-CM | POA: Diagnosis not present

## 2020-05-21 DIAGNOSIS — I959 Hypotension, unspecified: Secondary | ICD-10-CM | POA: Diagnosis not present

## 2020-05-21 DIAGNOSIS — I4891 Unspecified atrial fibrillation: Secondary | ICD-10-CM | POA: Diagnosis not present

## 2020-05-21 DIAGNOSIS — G309 Alzheimer's disease, unspecified: Secondary | ICD-10-CM | POA: Diagnosis not present

## 2020-05-26 ENCOUNTER — Other Ambulatory Visit: Payer: Self-pay

## 2020-05-26 DIAGNOSIS — I4891 Unspecified atrial fibrillation: Secondary | ICD-10-CM | POA: Diagnosis not present

## 2020-05-26 DIAGNOSIS — G8929 Other chronic pain: Secondary | ICD-10-CM | POA: Diagnosis not present

## 2020-05-26 DIAGNOSIS — E46 Unspecified protein-calorie malnutrition: Secondary | ICD-10-CM | POA: Diagnosis not present

## 2020-05-26 DIAGNOSIS — G309 Alzheimer's disease, unspecified: Secondary | ICD-10-CM | POA: Diagnosis not present

## 2020-05-26 DIAGNOSIS — M542 Cervicalgia: Secondary | ICD-10-CM | POA: Diagnosis not present

## 2020-05-26 DIAGNOSIS — I959 Hypotension, unspecified: Secondary | ICD-10-CM | POA: Diagnosis not present

## 2020-05-26 DIAGNOSIS — E785 Hyperlipidemia, unspecified: Secondary | ICD-10-CM | POA: Diagnosis not present

## 2020-05-26 DIAGNOSIS — M549 Dorsalgia, unspecified: Secondary | ICD-10-CM | POA: Diagnosis not present

## 2020-05-26 DIAGNOSIS — I350 Nonrheumatic aortic (valve) stenosis: Secondary | ICD-10-CM | POA: Diagnosis not present

## 2020-05-26 MED ORDER — MIDODRINE HCL 2.5 MG PO TABS: 2.5000 mg | ORAL_TABLET | Freq: Three times a day (TID) | ORAL | 0 refills | Status: DC

## 2020-05-27 ENCOUNTER — Other Ambulatory Visit: Payer: Self-pay

## 2020-05-27 DIAGNOSIS — G309 Alzheimer's disease, unspecified: Secondary | ICD-10-CM | POA: Diagnosis not present

## 2020-05-27 DIAGNOSIS — G8929 Other chronic pain: Secondary | ICD-10-CM | POA: Diagnosis not present

## 2020-05-27 DIAGNOSIS — I959 Hypotension, unspecified: Secondary | ICD-10-CM | POA: Diagnosis not present

## 2020-05-27 DIAGNOSIS — M542 Cervicalgia: Secondary | ICD-10-CM | POA: Diagnosis not present

## 2020-05-27 DIAGNOSIS — E785 Hyperlipidemia, unspecified: Secondary | ICD-10-CM | POA: Diagnosis not present

## 2020-05-27 DIAGNOSIS — I350 Nonrheumatic aortic (valve) stenosis: Secondary | ICD-10-CM | POA: Diagnosis not present

## 2020-05-27 DIAGNOSIS — M549 Dorsalgia, unspecified: Secondary | ICD-10-CM | POA: Diagnosis not present

## 2020-05-27 DIAGNOSIS — I4891 Unspecified atrial fibrillation: Secondary | ICD-10-CM | POA: Diagnosis not present

## 2020-05-27 DIAGNOSIS — E46 Unspecified protein-calorie malnutrition: Secondary | ICD-10-CM | POA: Diagnosis not present

## 2020-05-27 MED ORDER — AMIODARONE HCL 200 MG PO TABS: 200.0000 mg | ORAL_TABLET | Freq: Two times a day (BID) | ORAL | 0 refills | Status: AC

## 2020-05-28 DIAGNOSIS — G8929 Other chronic pain: Secondary | ICD-10-CM | POA: Diagnosis not present

## 2020-05-28 DIAGNOSIS — I350 Nonrheumatic aortic (valve) stenosis: Secondary | ICD-10-CM | POA: Diagnosis not present

## 2020-05-28 DIAGNOSIS — I959 Hypotension, unspecified: Secondary | ICD-10-CM | POA: Diagnosis not present

## 2020-05-28 DIAGNOSIS — M542 Cervicalgia: Secondary | ICD-10-CM | POA: Diagnosis not present

## 2020-05-28 DIAGNOSIS — M549 Dorsalgia, unspecified: Secondary | ICD-10-CM | POA: Diagnosis not present

## 2020-05-28 DIAGNOSIS — G309 Alzheimer's disease, unspecified: Secondary | ICD-10-CM | POA: Diagnosis not present

## 2020-05-28 DIAGNOSIS — E785 Hyperlipidemia, unspecified: Secondary | ICD-10-CM | POA: Diagnosis not present

## 2020-05-28 DIAGNOSIS — E46 Unspecified protein-calorie malnutrition: Secondary | ICD-10-CM | POA: Diagnosis not present

## 2020-05-28 DIAGNOSIS — I4891 Unspecified atrial fibrillation: Secondary | ICD-10-CM | POA: Diagnosis not present

## 2020-05-29 DIAGNOSIS — E46 Unspecified protein-calorie malnutrition: Secondary | ICD-10-CM | POA: Diagnosis not present

## 2020-05-29 DIAGNOSIS — I350 Nonrheumatic aortic (valve) stenosis: Secondary | ICD-10-CM | POA: Diagnosis not present

## 2020-05-29 DIAGNOSIS — G8929 Other chronic pain: Secondary | ICD-10-CM | POA: Diagnosis not present

## 2020-05-29 DIAGNOSIS — I4891 Unspecified atrial fibrillation: Secondary | ICD-10-CM | POA: Diagnosis not present

## 2020-05-29 DIAGNOSIS — M542 Cervicalgia: Secondary | ICD-10-CM | POA: Diagnosis not present

## 2020-05-29 DIAGNOSIS — M549 Dorsalgia, unspecified: Secondary | ICD-10-CM | POA: Diagnosis not present

## 2020-05-29 DIAGNOSIS — I959 Hypotension, unspecified: Secondary | ICD-10-CM | POA: Diagnosis not present

## 2020-05-29 DIAGNOSIS — E785 Hyperlipidemia, unspecified: Secondary | ICD-10-CM | POA: Diagnosis not present

## 2020-05-29 DIAGNOSIS — G309 Alzheimer's disease, unspecified: Secondary | ICD-10-CM | POA: Diagnosis not present

## 2020-06-01 DIAGNOSIS — M549 Dorsalgia, unspecified: Secondary | ICD-10-CM | POA: Diagnosis not present

## 2020-06-01 DIAGNOSIS — E46 Unspecified protein-calorie malnutrition: Secondary | ICD-10-CM | POA: Diagnosis not present

## 2020-06-01 DIAGNOSIS — G309 Alzheimer's disease, unspecified: Secondary | ICD-10-CM | POA: Diagnosis not present

## 2020-06-01 DIAGNOSIS — M542 Cervicalgia: Secondary | ICD-10-CM | POA: Diagnosis not present

## 2020-06-01 DIAGNOSIS — I350 Nonrheumatic aortic (valve) stenosis: Secondary | ICD-10-CM | POA: Diagnosis not present

## 2020-06-01 DIAGNOSIS — I4891 Unspecified atrial fibrillation: Secondary | ICD-10-CM | POA: Diagnosis not present

## 2020-06-01 DIAGNOSIS — E785 Hyperlipidemia, unspecified: Secondary | ICD-10-CM | POA: Diagnosis not present

## 2020-06-01 DIAGNOSIS — I959 Hypotension, unspecified: Secondary | ICD-10-CM | POA: Diagnosis not present

## 2020-06-01 DIAGNOSIS — G8929 Other chronic pain: Secondary | ICD-10-CM | POA: Diagnosis not present

## 2020-06-03 DIAGNOSIS — G8929 Other chronic pain: Secondary | ICD-10-CM | POA: Diagnosis not present

## 2020-06-03 DIAGNOSIS — M549 Dorsalgia, unspecified: Secondary | ICD-10-CM | POA: Diagnosis not present

## 2020-06-03 DIAGNOSIS — E785 Hyperlipidemia, unspecified: Secondary | ICD-10-CM | POA: Diagnosis not present

## 2020-06-03 DIAGNOSIS — I4891 Unspecified atrial fibrillation: Secondary | ICD-10-CM | POA: Diagnosis not present

## 2020-06-03 DIAGNOSIS — G309 Alzheimer's disease, unspecified: Secondary | ICD-10-CM | POA: Diagnosis not present

## 2020-06-03 DIAGNOSIS — I959 Hypotension, unspecified: Secondary | ICD-10-CM | POA: Diagnosis not present

## 2020-06-03 DIAGNOSIS — M542 Cervicalgia: Secondary | ICD-10-CM | POA: Diagnosis not present

## 2020-06-03 DIAGNOSIS — E46 Unspecified protein-calorie malnutrition: Secondary | ICD-10-CM | POA: Diagnosis not present

## 2020-06-03 DIAGNOSIS — I350 Nonrheumatic aortic (valve) stenosis: Secondary | ICD-10-CM | POA: Diagnosis not present

## 2020-06-08 DIAGNOSIS — G8929 Other chronic pain: Secondary | ICD-10-CM | POA: Diagnosis not present

## 2020-06-08 DIAGNOSIS — M549 Dorsalgia, unspecified: Secondary | ICD-10-CM | POA: Diagnosis not present

## 2020-06-08 DIAGNOSIS — I4891 Unspecified atrial fibrillation: Secondary | ICD-10-CM | POA: Diagnosis not present

## 2020-06-08 DIAGNOSIS — E785 Hyperlipidemia, unspecified: Secondary | ICD-10-CM | POA: Diagnosis not present

## 2020-06-08 DIAGNOSIS — I350 Nonrheumatic aortic (valve) stenosis: Secondary | ICD-10-CM | POA: Diagnosis not present

## 2020-06-08 DIAGNOSIS — I959 Hypotension, unspecified: Secondary | ICD-10-CM | POA: Diagnosis not present

## 2020-06-08 DIAGNOSIS — E46 Unspecified protein-calorie malnutrition: Secondary | ICD-10-CM | POA: Diagnosis not present

## 2020-06-08 DIAGNOSIS — M542 Cervicalgia: Secondary | ICD-10-CM | POA: Diagnosis not present

## 2020-06-08 DIAGNOSIS — G309 Alzheimer's disease, unspecified: Secondary | ICD-10-CM | POA: Diagnosis not present

## 2020-06-11 ENCOUNTER — Other Ambulatory Visit: Payer: Self-pay

## 2020-06-11 DIAGNOSIS — G309 Alzheimer's disease, unspecified: Secondary | ICD-10-CM | POA: Diagnosis not present

## 2020-06-11 DIAGNOSIS — G8929 Other chronic pain: Secondary | ICD-10-CM | POA: Diagnosis not present

## 2020-06-11 DIAGNOSIS — I4891 Unspecified atrial fibrillation: Secondary | ICD-10-CM | POA: Diagnosis not present

## 2020-06-11 DIAGNOSIS — M549 Dorsalgia, unspecified: Secondary | ICD-10-CM | POA: Diagnosis not present

## 2020-06-11 DIAGNOSIS — I959 Hypotension, unspecified: Secondary | ICD-10-CM | POA: Diagnosis not present

## 2020-06-11 DIAGNOSIS — M542 Cervicalgia: Secondary | ICD-10-CM | POA: Diagnosis not present

## 2020-06-11 DIAGNOSIS — E46 Unspecified protein-calorie malnutrition: Secondary | ICD-10-CM | POA: Diagnosis not present

## 2020-06-11 DIAGNOSIS — E785 Hyperlipidemia, unspecified: Secondary | ICD-10-CM | POA: Diagnosis not present

## 2020-06-11 DIAGNOSIS — I350 Nonrheumatic aortic (valve) stenosis: Secondary | ICD-10-CM | POA: Diagnosis not present

## 2020-06-12 MED ORDER — DIGOXIN 62.5 MCG PO TABS: 1.0000 | ORAL_TABLET | ORAL | 2 refills | Status: DC

## 2020-06-15 DIAGNOSIS — I350 Nonrheumatic aortic (valve) stenosis: Secondary | ICD-10-CM | POA: Diagnosis not present

## 2020-06-15 DIAGNOSIS — M549 Dorsalgia, unspecified: Secondary | ICD-10-CM | POA: Diagnosis not present

## 2020-06-15 DIAGNOSIS — E785 Hyperlipidemia, unspecified: Secondary | ICD-10-CM | POA: Diagnosis not present

## 2020-06-15 DIAGNOSIS — M542 Cervicalgia: Secondary | ICD-10-CM | POA: Diagnosis not present

## 2020-06-15 DIAGNOSIS — G8929 Other chronic pain: Secondary | ICD-10-CM | POA: Diagnosis not present

## 2020-06-15 DIAGNOSIS — E46 Unspecified protein-calorie malnutrition: Secondary | ICD-10-CM | POA: Diagnosis not present

## 2020-06-15 DIAGNOSIS — I4891 Unspecified atrial fibrillation: Secondary | ICD-10-CM | POA: Diagnosis not present

## 2020-06-15 DIAGNOSIS — I959 Hypotension, unspecified: Secondary | ICD-10-CM | POA: Diagnosis not present

## 2020-06-15 DIAGNOSIS — G309 Alzheimer's disease, unspecified: Secondary | ICD-10-CM | POA: Diagnosis not present

## 2020-06-17 DIAGNOSIS — E46 Unspecified protein-calorie malnutrition: Secondary | ICD-10-CM | POA: Diagnosis not present

## 2020-06-17 DIAGNOSIS — I350 Nonrheumatic aortic (valve) stenosis: Secondary | ICD-10-CM | POA: Diagnosis not present

## 2020-06-17 DIAGNOSIS — G8929 Other chronic pain: Secondary | ICD-10-CM | POA: Diagnosis not present

## 2020-06-17 DIAGNOSIS — I4891 Unspecified atrial fibrillation: Secondary | ICD-10-CM | POA: Diagnosis not present

## 2020-06-17 DIAGNOSIS — M549 Dorsalgia, unspecified: Secondary | ICD-10-CM | POA: Diagnosis not present

## 2020-06-17 DIAGNOSIS — G309 Alzheimer's disease, unspecified: Secondary | ICD-10-CM | POA: Diagnosis not present

## 2020-06-17 DIAGNOSIS — M542 Cervicalgia: Secondary | ICD-10-CM | POA: Diagnosis not present

## 2020-06-17 DIAGNOSIS — I959 Hypotension, unspecified: Secondary | ICD-10-CM | POA: Diagnosis not present

## 2020-06-17 DIAGNOSIS — E785 Hyperlipidemia, unspecified: Secondary | ICD-10-CM | POA: Diagnosis not present

## 2020-06-23 DIAGNOSIS — M549 Dorsalgia, unspecified: Secondary | ICD-10-CM | POA: Diagnosis not present

## 2020-06-23 DIAGNOSIS — E785 Hyperlipidemia, unspecified: Secondary | ICD-10-CM | POA: Diagnosis not present

## 2020-06-23 DIAGNOSIS — G309 Alzheimer's disease, unspecified: Secondary | ICD-10-CM | POA: Diagnosis not present

## 2020-06-23 DIAGNOSIS — I959 Hypotension, unspecified: Secondary | ICD-10-CM | POA: Diagnosis not present

## 2020-06-23 DIAGNOSIS — G8929 Other chronic pain: Secondary | ICD-10-CM | POA: Diagnosis not present

## 2020-06-23 DIAGNOSIS — I350 Nonrheumatic aortic (valve) stenosis: Secondary | ICD-10-CM | POA: Diagnosis not present

## 2020-06-23 DIAGNOSIS — I4891 Unspecified atrial fibrillation: Secondary | ICD-10-CM | POA: Diagnosis not present

## 2020-06-23 DIAGNOSIS — E46 Unspecified protein-calorie malnutrition: Secondary | ICD-10-CM | POA: Diagnosis not present

## 2020-06-23 DIAGNOSIS — M542 Cervicalgia: Secondary | ICD-10-CM | POA: Diagnosis not present

## 2020-06-25 DIAGNOSIS — M549 Dorsalgia, unspecified: Secondary | ICD-10-CM | POA: Diagnosis not present

## 2020-06-25 DIAGNOSIS — M542 Cervicalgia: Secondary | ICD-10-CM | POA: Diagnosis not present

## 2020-06-25 DIAGNOSIS — G8929 Other chronic pain: Secondary | ICD-10-CM | POA: Diagnosis not present

## 2020-06-25 DIAGNOSIS — I4891 Unspecified atrial fibrillation: Secondary | ICD-10-CM | POA: Diagnosis not present

## 2020-06-25 DIAGNOSIS — G309 Alzheimer's disease, unspecified: Secondary | ICD-10-CM | POA: Diagnosis not present

## 2020-06-25 DIAGNOSIS — E46 Unspecified protein-calorie malnutrition: Secondary | ICD-10-CM | POA: Diagnosis not present

## 2020-06-25 DIAGNOSIS — I350 Nonrheumatic aortic (valve) stenosis: Secondary | ICD-10-CM | POA: Diagnosis not present

## 2020-06-25 DIAGNOSIS — I959 Hypotension, unspecified: Secondary | ICD-10-CM | POA: Diagnosis not present

## 2020-06-25 DIAGNOSIS — E785 Hyperlipidemia, unspecified: Secondary | ICD-10-CM | POA: Diagnosis not present

## 2020-07-01 ENCOUNTER — Ambulatory Visit: Payer: Medicare PPO | Admitting: Cardiology

## 2020-07-01 ENCOUNTER — Encounter: Payer: Self-pay | Admitting: Cardiology

## 2020-07-01 ENCOUNTER — Other Ambulatory Visit: Payer: Self-pay

## 2020-07-01 VITALS — BP 120/72 | HR 75 | Ht 62.0 in | Wt 100.2 lb

## 2020-07-01 DIAGNOSIS — I480 Paroxysmal atrial fibrillation: Secondary | ICD-10-CM

## 2020-07-01 DIAGNOSIS — I9589 Other hypotension: Secondary | ICD-10-CM | POA: Diagnosis not present

## 2020-07-01 DIAGNOSIS — Z79899 Other long term (current) drug therapy: Secondary | ICD-10-CM | POA: Diagnosis not present

## 2020-07-01 DIAGNOSIS — I350 Nonrheumatic aortic (valve) stenosis: Secondary | ICD-10-CM | POA: Diagnosis not present

## 2020-07-01 DIAGNOSIS — Z7901 Long term (current) use of anticoagulants: Secondary | ICD-10-CM

## 2020-07-01 NOTE — Patient Instructions (Signed)
Medication Instructions:  Your physician recommends that you continue on your current medications as directed. Please refer to the Current Medication list given to you today.  *If you need a refill on your cardiac medications before your next appointment, please call your pharmacy*   Lab Work: Your physician recommends that you return for lab work in: TODAY CMP, Lipids, TSH, T3, T4, Digoxin If you have labs (blood work) drawn today and your tests are completely normal, you will receive your results only by: Marland Kitchen MyChart Message (if you have MyChart) OR . A paper copy in the mail If you have any lab test that is abnormal or we need to change your treatment, we will call you to review the results.   Testing/Procedures: None   Follow-Up: At [facility], you and your health needs are our priority.  As part of our continuing mission to provide you with exceptional heart care, we have created designated Provider Care Teams.  These Care Teams include your primary Cardiologist (physician) and Advanced Practice Providers (APPs -  Physician Assistants and Nurse Practitioners) who all work together to provide you with the care you need, when you need it.  We recommend signing up for the patient portal called "MyChart".  Sign up information is provided on this After Visit Summary.  MyChart is used to connect with patients for Virtual Visits (Telemedicine).  Patients are able to view lab/test results, encounter notes, upcoming appointments, etc.  Non-urgent messages can be sent to your provider as well.   To learn more about what you can do with MyChart, go to [email or web address].    Your next appointment:   3 month(s)  The format for your next appointment:   In Person  Provider:   Norman Herrlich, MD   Other Instructions

## 2020-07-01 NOTE — Progress Notes (Signed)
Cardiology Office Note:    Date:  07/01/2020   ID:  Rhonda Rivera, DOB [birth year], MRN [id number]  PCP:  Rhonda Ohara, MD  Cardiologist:  Rhonda Herrlich, MD    Referring MD: Rhonda Ohara, MD    ASSESSMENT:    1. PAF (paroxysmal atrial fibrillation) (HCC)   2. On amiodarone therapy   3. Chronic anticoagulation   4. Severe aortic stenosis   5. Other specified hypotension   6. High risk medication use    PLAN:    In order of problems listed above:  1. She is doing better on low-dose amiodarone maintaining sinus rhythm and tolerates her anticoagulant without bleeding complication. 2. Hypotension is resolved continue midodrine 3. No evidence of heart failure not having symptoms related to her severe aortic stenosis 4. Her care is palliative continue current treatment I will see back in the office in 3 months today we will check labs including CMP and thyroid test   Next appointment: 3 months   Medication Adjustments/Labs and Tests Ordered: Current medicines are reviewed at length with the patient today.  Concerns regarding medicines are outlined above.  Orders Placed This Encounter  Procedures  . Comprehensive metabolic panel  . Lipid panel  . TSH+T4F+T3Free  . Digoxin level   No orders of the defined types were placed in this encounter.   Chief Complaint  Patient presents with  . Follow-up  . Hypotension  . Atrial Fibrillation  . Aortic Stenosis    History of Present Illness:    Rhonda Rivera is a 80 y.o. female with a hx of paroxysmal atrial fibrillation on amiodarone and very low-dose digoxin anticoagulated with reduced dose apixaban low-flow low gradient aortic stenosis with reduced ejection fraction hypertension and hyper lipidemia.  She was last seen 04/28/2020 by my partner Dr Rhonda Rivera.  She was felt not to be a candidate for TAVR because of comorbidities and frailty.  She also had hypotension and required midodrine therapy.  Compliance with diet, lifestyle  and medications: Yes   Ref Range & Units 2 mo ago  Digitoxin Lvl 10 - 25 ng/mL <5.0   She has had a significant improvement she continues to take midodrine does not have hypotension she takes her amiodarone and she has had a regular rhythm and she has had no edema shortness of breath chest pain palpitation or syncope.  Both her and her husband are pleased with the quality of her life.  She has had no bleeding from her anticoagulant Past Medical History:  Diagnosis Date  . GERD (gastroesophageal reflux disease) 07/13/2017  . Hyperlipidemia 07/13/2017  . Osteoporosis 07/13/2017  . Vitamin D deficiency 07/13/2017    Past Surgical History:  Procedure Laterality Date  . ABDOMINAL HYSTERECTOMY    . LEG SURGERY      Current Medications: Current Meds  Medication Sig  . amiodarone (PACERONE) 200 MG tablet Take 1 tablet (200 mg total) by mouth in the morning and at bedtime.  Marland Kitchen apixaban (ELIQUIS) 2.5 MG TABS tablet Take 2.5 mg by mouth 2 (two) times daily.  . midodrine (PROAMATINE) 2.5 MG tablet Take 1 tablet (2.5 mg total) by mouth 3 (three) times daily with meals.  . [DISCONTINUED] diclofenac (FLECTOR) 1.3 % PTCH Place 1 patch onto the skin 2 (two) times daily.  . [DISCONTINUED] digoxin (LANOXIN) 0.0625 mg TABS tablet Take 0.0625 mg by mouth every 3 (three) days.  . [DISCONTINUED] Digoxin 62.5 MCG TABS Take 1 tablet by mouth every 3 (three) days.  . [  DISCONTINUED] mirtazapine (REMERON) 15 MG tablet Take 1 tablet (15 mg total) by mouth at bedtime.  . [DISCONTINUED] tizanidine (ZANAFLEX) 2 MG capsule Take 1 capsule (2 mg total) by mouth 3 (three) times daily.   Current Facility-Administered Medications for the 07/01/20 encounter (Office Visit) with Baldo Daub, MD  Medication  . triamcinolone acetonide (KENALOG-40) injection 40 mg     Allergies:   Celebrex [celecoxib] and Penicillins   Social History   Socioeconomic History  . Marital status: Married    Spouse name: Not on file  .  Number of children: 2  . Years of education: Not on file  . Highest education level: Not on file  Occupational History  . Occupation: Retired  Tobacco Use  . Smoking status: Never Smoker  . Smokeless tobacco: Never Used  Vaping Use  . Vaping Use: Never used  Substance and Sexual Activity  . Alcohol use: Never  . Drug use: Never  . Sexual activity: Yes    Partners: Male  Other Topics Concern  . Not on file  Social History Narrative  . Not on file   Social Determinants of Health   Financial Resource Strain: Not on file  Food Insecurity: Not on file  Transportation Needs: Not on file  Physical Activity: Not on file  Stress: Not on file  Social Connections: Not on file     Family History: The patient's family history includes Heart attack in her father. ROS:   Please see the history of present illness.    All other systems reviewed and are negative.  EKGs/Labs/Other Studies Reviewed:    The following studies were reviewed today:  EKG: Her EKG last visit 04/28/2020 independently reviewed sinus rhythm ST change digitalis effect  Recent Labs: 08/26/2019: TSH 2.600 03/03/2020: ALT 7; Hemoglobin 14.8; Platelets 307 04/28/2020: BUN 7; Creatinine, Ser 0.59; Magnesium 2.3; Potassium 3.9; Sodium 141  Recent Lipid Panel    Component Value Date/Time   CHOL 234 (H) 03/03/2020 0836   TRIG 69 03/03/2020 0836   HDL 74 03/03/2020 0836   CHOLHDL 3.2 03/03/2020 0836   LDLCALC 148 (H) 03/03/2020 0836    Physical Exam:    VS:  BP 120/72   Pulse 75   Ht 5\' 2"  (1.575 m)   Wt 100 lb 3.2 oz (45.5 kg)   SpO2 95%   BMI 18.33 kg/m     Wt Readings from Last 3 Encounters:  07/01/20 100 lb 3.2 oz (45.5 kg)  05/04/20 93 lb 9.6 oz (42.5 kg)  04/28/20 92 lb (41.7 kg)     GEN: She looks very frail chronically ill poor muscle mass but very alert talkative today in no acute distress HEENT: Normal NECK: No JVD; No carotid bruits LYMPHATICS: No lymphadenopathy CARDIAC: 3-4 of 6 AAS murmur  radiates to the carotids small volume and encompasses the second heart sound RRR, no aortic regurgitation, rubs, gallops RESPIRATORY:  Clear to auscultation without rales, wheezing or rhonchi  ABDOMEN: Soft, non-tender, non-distended MUSCULOSKELETAL:  No edema; No deformity  SKIN: Warm and dry NEUROLOGIC:  Alert and oriented x 3 PSYCHIATRIC:  Normal affect    Signed, 06/28/20, MD  07/01/2020 3:06 PM    [facility]

## 2020-07-01 NOTE — Addendum Note (Signed)
Addended by: Delorse Limber I on: 07/01/2020 03:12 PM   Modules accepted: Orders

## 2020-07-02 ENCOUNTER — Telehealth: Payer: Self-pay

## 2020-07-02 LAB — COMPREHENSIVE METABOLIC PANEL
ALT: 9 IU/L (ref 0–32)
AST: 11 IU/L (ref 0–40)
Albumin/Globulin Ratio: 2.1 (ref 1.2–2.2)
Albumin: 3.8 g/dL (ref 3.7–4.7)
Alkaline Phosphatase: 53 IU/L (ref 44–121)
BUN/Creatinine Ratio: 13 (ref 12–28)
BUN: 9 mg/dL (ref 8–27)
Bilirubin Total: 0.5 mg/dL (ref 0.0–1.2)
CO2: 24 mmol/L (ref 20–29)
Calcium: 8.6 mg/dL — ABNORMAL LOW (ref 8.7–10.3)
Chloride: 101 mmol/L (ref 96–106)
Creatinine, Ser: 0.68 mg/dL (ref 0.57–1.00)
Globulin, Total: 1.8 g/dL (ref 1.5–4.5)
Glucose: 99 mg/dL (ref 65–99)
Potassium: 3.8 mmol/L (ref 3.5–5.2)
Sodium: 138 mmol/L (ref 134–144)
Total Protein: 5.6 g/dL — ABNORMAL LOW (ref 6.0–8.5)
eGFR: 88 mL/min/{1.73_m2} (ref >59)

## 2020-07-02 LAB — LIPID PANEL
Chol/HDL Ratio: 2.7 ratio (ref 0.0–4.4)
Cholesterol, Total: 204 mg/dL — ABNORMAL HIGH (ref 100–199)
HDL: 75 mg/dL (ref >39)
LDL Chol Calc (NIH): 119 mg/dL — ABNORMAL HIGH (ref 0–99)
Triglycerides: 52 mg/dL (ref 0–149)
VLDL Cholesterol Cal: 10 mg/dL (ref 5–40)

## 2020-07-02 LAB — TSH+T4F+T3FREE
Free T4: 1.23 ng/dL (ref 0.82–1.77)
T3, Free: 1.8 pg/mL — ABNORMAL LOW (ref 2.0–4.4)
TSH: 5.62 u[IU]/mL — ABNORMAL HIGH (ref 0.450–4.500)

## 2020-07-02 NOTE — Telephone Encounter (Signed)
-----   Message from Baldo Daub, MD sent at 07/02/2020  7:49 AM EDT ----- Normal or stable result No changes

## 2020-07-02 NOTE — Telephone Encounter (Signed)
Spoke with patient regarding results and recommendation.  Patient verbalizes understanding and is agreeable to plan of care. Advised patient to call back with any issues or concerns.  

## 2020-07-11 ENCOUNTER — Other Ambulatory Visit: Payer: Self-pay | Admitting: Physician Assistant

## 2020-07-13 ENCOUNTER — Other Ambulatory Visit: Payer: Self-pay

## 2020-07-13 MED ORDER — APIXABAN 2.5 MG PO TABS: 2.5000 mg | ORAL_TABLET | Freq: Two times a day (BID) | ORAL | 1 refills | Status: AC

## 2020-07-26 ENCOUNTER — Other Ambulatory Visit: Payer: Self-pay | Admitting: Family Medicine

## 2020-08-07 DIAGNOSIS — S42209A Unspecified fracture of upper end of unspecified humerus, initial encounter for closed fracture: Secondary | ICD-10-CM | POA: Diagnosis not present

## 2020-08-07 DIAGNOSIS — I429 Cardiomyopathy, unspecified: Secondary | ICD-10-CM | POA: Diagnosis not present

## 2020-08-07 DIAGNOSIS — S4292XA Fracture of left shoulder girdle, part unspecified, initial encounter for closed fracture: Secondary | ICD-10-CM | POA: Diagnosis not present

## 2020-08-07 DIAGNOSIS — I248 Other forms of acute ischemic heart disease: Secondary | ICD-10-CM | POA: Diagnosis not present

## 2020-08-07 DIAGNOSIS — I959 Hypotension, unspecified: Secondary | ICD-10-CM | POA: Diagnosis not present

## 2020-08-07 DIAGNOSIS — I482 Chronic atrial fibrillation, unspecified: Secondary | ICD-10-CM | POA: Diagnosis not present

## 2020-08-07 DIAGNOSIS — I5022 Chronic systolic (congestive) heart failure: Secondary | ICD-10-CM | POA: Diagnosis not present

## 2020-08-07 DIAGNOSIS — J189 Pneumonia, unspecified organism: Secondary | ICD-10-CM | POA: Diagnosis not present

## 2020-08-07 DIAGNOSIS — J90 Pleural effusion, not elsewhere classified: Secondary | ICD-10-CM | POA: Diagnosis not present

## 2020-08-07 DIAGNOSIS — I4891 Unspecified atrial fibrillation: Secondary | ICD-10-CM | POA: Diagnosis not present

## 2020-08-07 DIAGNOSIS — I517 Cardiomegaly: Secondary | ICD-10-CM | POA: Diagnosis not present

## 2020-08-07 DIAGNOSIS — Z681 Body mass index (BMI) 19 or less, adult: Secondary | ICD-10-CM | POA: Diagnosis not present

## 2020-08-07 DIAGNOSIS — S72142A Displaced intertrochanteric fracture of left femur, initial encounter for closed fracture: Secondary | ICD-10-CM | POA: Diagnosis not present

## 2020-08-07 DIAGNOSIS — M25552 Pain in left hip: Secondary | ICD-10-CM | POA: Diagnosis not present

## 2020-08-07 DIAGNOSIS — I5020 Unspecified systolic (congestive) heart failure: Secondary | ICD-10-CM | POA: Diagnosis not present

## 2020-08-07 DIAGNOSIS — I350 Nonrheumatic aortic (valve) stenosis: Secondary | ICD-10-CM | POA: Diagnosis not present

## 2020-08-07 DIAGNOSIS — S72002A Fracture of unspecified part of neck of left femur, initial encounter for closed fracture: Secondary | ICD-10-CM | POA: Diagnosis not present

## 2020-08-07 DIAGNOSIS — M25519 Pain in unspecified shoulder: Secondary | ICD-10-CM | POA: Diagnosis not present

## 2020-08-07 DIAGNOSIS — I10 Essential (primary) hypertension: Secondary | ICD-10-CM | POA: Diagnosis not present

## 2020-08-07 DIAGNOSIS — S42202A Unspecified fracture of upper end of left humerus, initial encounter for closed fracture: Secondary | ICD-10-CM | POA: Diagnosis not present

## 2020-08-07 DIAGNOSIS — R404 Transient alteration of awareness: Secondary | ICD-10-CM | POA: Diagnosis not present

## 2020-08-07 DIAGNOSIS — R0902 Hypoxemia: Secondary | ICD-10-CM | POA: Diagnosis not present

## 2020-08-07 DIAGNOSIS — W19XXXA Unspecified fall, initial encounter: Secondary | ICD-10-CM | POA: Diagnosis not present

## 2020-08-07 DIAGNOSIS — E43 Unspecified severe protein-calorie malnutrition: Secondary | ICD-10-CM | POA: Diagnosis not present

## 2020-08-08 DIAGNOSIS — S42209A Unspecified fracture of upper end of unspecified humerus, initial encounter for closed fracture: Secondary | ICD-10-CM

## 2020-08-08 DIAGNOSIS — I4891 Unspecified atrial fibrillation: Secondary | ICD-10-CM

## 2020-08-08 DIAGNOSIS — I350 Nonrheumatic aortic (valve) stenosis: Secondary | ICD-10-CM

## 2020-08-08 DIAGNOSIS — I5020 Unspecified systolic (congestive) heart failure: Secondary | ICD-10-CM | POA: Diagnosis not present

## 2020-08-08 DIAGNOSIS — S72142A Displaced intertrochanteric fracture of left femur, initial encounter for closed fracture: Secondary | ICD-10-CM

## 2020-08-08 DIAGNOSIS — I959 Hypotension, unspecified: Secondary | ICD-10-CM

## 2020-08-09 DIAGNOSIS — I350 Nonrheumatic aortic (valve) stenosis: Secondary | ICD-10-CM | POA: Diagnosis not present

## 2020-08-09 DIAGNOSIS — I4891 Unspecified atrial fibrillation: Secondary | ICD-10-CM | POA: Diagnosis not present

## 2020-08-09 DIAGNOSIS — I959 Hypotension, unspecified: Secondary | ICD-10-CM | POA: Diagnosis not present

## 2020-08-09 DIAGNOSIS — S42209A Unspecified fracture of upper end of unspecified humerus, initial encounter for closed fracture: Secondary | ICD-10-CM | POA: Diagnosis not present

## 2020-08-09 DIAGNOSIS — S72142A Displaced intertrochanteric fracture of left femur, initial encounter for closed fracture: Secondary | ICD-10-CM | POA: Diagnosis not present

## 2020-08-24 DEATH — deceased

## 2020-08-31 ENCOUNTER — Ambulatory Visit: Payer: Medicare PPO | Admitting: Family Medicine

## 2020-10-01 ENCOUNTER — Ambulatory Visit: Payer: Medicare PPO | Admitting: Cardiology

## 2020-10-13 NOTE — Telephone Encounter (Signed)
Opened in error
